# Patient Record
Sex: Female | Born: 1970
Health system: Southern US, Community
[De-identification: ages and names within clinical notes are randomized; demographics above are authoritative.]

## PROBLEM LIST (undated history)

## (undated) DIAGNOSIS — M5030 Other cervical disc degeneration, unspecified cervical region: Secondary | ICD-10-CM

## (undated) DIAGNOSIS — E669 Obesity, unspecified: Secondary | ICD-10-CM

## (undated) DIAGNOSIS — R739 Hyperglycemia, unspecified: Secondary | ICD-10-CM

## (undated) DIAGNOSIS — F419 Anxiety disorder, unspecified: Secondary | ICD-10-CM

## (undated) DIAGNOSIS — F988 Other specified behavioral and emotional disorders with onset usually occurring in childhood and adolescence: Secondary | ICD-10-CM

## (undated) DIAGNOSIS — I10 Essential (primary) hypertension: Secondary | ICD-10-CM

## (undated) DIAGNOSIS — D649 Anemia, unspecified: Secondary | ICD-10-CM

## (undated) HISTORY — DX: Obesity, unspecified: E66.9

## (undated) HISTORY — DX: Essential (primary) hypertension: I10

## (undated) HISTORY — PX: TONSILLECTOMY AND ADENOIDECTOMY: SHX28

## (undated) HISTORY — DX: Anxiety disorder, unspecified: F41.9

## (undated) HISTORY — PX: CHOLECYSTECTOMY: SHX55

## (undated) HISTORY — DX: Anemia, unspecified: D64.9

## (undated) HISTORY — DX: Hyperglycemia, unspecified: R73.9

## (undated) HISTORY — DX: Other cervical disc degeneration, unspecified cervical region: M50.30

## (undated) HISTORY — PX: OVARIAN CYST DRAINAGE: SHX325

## (undated) HISTORY — DX: Other specified behavioral and emotional disorders with onset usually occurring in childhood and adolescence: F98.8

---

## 1998-09-09 ENCOUNTER — Other Ambulatory Visit: Admission: RE | Admit: 1998-09-09 | Discharge: 1998-09-09 | Payer: Self-pay | Admitting: Obstetrics and Gynecology

## 2008-01-09 ENCOUNTER — Emergency Department (HOSPITAL_COMMUNITY): Admission: EM | Admit: 2008-01-09 | Discharge: 2008-01-09 | Payer: Self-pay | Admitting: Emergency Medicine

## 2008-04-08 ENCOUNTER — Ambulatory Visit: Payer: Self-pay | Admitting: Internal Medicine

## 2008-05-02 ENCOUNTER — Ambulatory Visit (HOSPITAL_COMMUNITY): Admission: RE | Admit: 2008-05-02 | Discharge: 2008-05-02 | Payer: Self-pay | Admitting: Internal Medicine

## 2008-05-02 ENCOUNTER — Ambulatory Visit: Payer: Self-pay | Admitting: Internal Medicine

## 2008-05-06 ENCOUNTER — Ambulatory Visit (HOSPITAL_COMMUNITY): Admission: RE | Admit: 2008-05-06 | Discharge: 2008-05-06 | Payer: Self-pay | Admitting: Internal Medicine

## 2009-08-27 ENCOUNTER — Emergency Department (HOSPITAL_COMMUNITY): Admission: EM | Admit: 2009-08-27 | Discharge: 2009-08-27 | Payer: Self-pay | Admitting: Emergency Medicine

## 2011-02-10 LAB — URINE CULTURE
Colony Count: NO GROWTH
Culture: NO GROWTH

## 2011-02-10 LAB — POCT URINALYSIS DIP (DEVICE)
Bilirubin Urine: NEGATIVE
Glucose, UA: NEGATIVE mg/dL
Ketones, ur: NEGATIVE mg/dL
Nitrite: NEGATIVE
Protein, ur: NEGATIVE mg/dL
Specific Gravity, Urine: 1.025 (ref 1.005–1.030)
Urobilinogen, UA: 0.2 mg/dL (ref 0.0–1.0)
pH: 5.5 (ref 5.0–8.0)

## 2011-02-10 LAB — POCT PREGNANCY, URINE: Preg Test, Ur: NEGATIVE

## 2011-03-22 NOTE — Consult Note (Signed)
NAMEJAMILYNN, WHITACRE             ACCOUNT NO.:  [id number]   MEDICAL RECORD NO.:  [id number]          PATIENT TYPE:  AMB   LOCATION:  DAY                           FACILITY:  [facility]   PHYSICIAN:  R. Roetta Sessions, M.D. DATE OF BIRTH:  Mar 22, 1971   DATE OF CONSULTATION:  DATE OF DISCHARGE:                                 CONSULTATION   REQUESTING PHYSICIAN:  Ellan Lambert, MD   REASON FOR CONSULTATION:  Hematochezia and chronic diarrhea.   HISTORY OF PRESENT ILLNESS:  Desiree Carter is a 40 year old female.  She  has a history of chronic diarrhea.  She tells me as long as she can  remember since her teen years, she has had significant postprandial  urgency and more recently she has had severe urgency on her way to work.  She was unable to make it to the bathroom.  She had a loose watery bowel  movement.  She complains of episodes of incontinence at times.  She has  significant abdominal bloating.  She has noticed bright red blood in  small-to-moderate amounts on the toilet paper on at least one occasion.  She notes that the abdominal pain, which is usually sharp and cramp-like  in her low pelvis and mid-abdomen, seems to be better post-defecation.  She tells me her symptoms come and go, but generally has diarrhea 5/7  days of the week.  She has anywhere from one to four loose stools.  She  denies any fevers, chills, nausea, vomiting, heartburn, indigestion,  dysphagia, odynophagia, anorexia, or early satiety.  She denies any new  medications.  She has had some fatigue in the last couple of days.  Laboratory studies from Dr. Christie Beckers office reported normal pelvic  ultrasound through Dr. Christie Beckers office.  She had a MET-7, which was  normal and normal CBC, except the platelet count of 512.  She had normal  LFTs on Mar 24, 2008.   PAST MEDICAL AND SURGICAL HISTORY:  Restless leg syndrome, chronic neck  pain, she is status post tonsillectomy and laparoscopic ovarian  cystectomy, she is status  post cholecystectomy secondary to  cholelithiasis in June 2007, and she has had an abscess removed from her  neck.   CURRENT MEDICATIONS:  See list of current medications;  1. Klonopin 1 mg nightly.  2. Neurontin 400 mg nightly.  3. Tizanidine 4 mg nightly.  4. Birth control pills once daily.  5. Vicodin p.r.n.  6. Allegra once daily.   ALLERGIES:  No known drug allergies.   FAMILY HISTORY:  There is no known family history of colorectal  carcinoma of liver or chronic GI problems.  Both mother and father have  history of hypertension, they are otherwise healthy.  She has one  healthy brother.   SOCIAL HISTORY:  Desiree Carter has been married for 11 years.  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  She denies any tobacco, alcohol, or drug use.   REVIEW OF SYSTEMS:  Desiree Carter has unintentionally lost 10 pounds in the  last year.  She denies any history  of dieting or increasing her  exercise.  GYN:  Her last menstrual period was around February 23, 2008.  She is on continuous birth control pills and has cycles regulated  through Dr. Christie Beckers office every couple of months.  Recent pelvic  ultrasound was negative for intrauterine pregnancy.   PHYSICAL EXAMINATION:  VITAL SIGNS:  Weight 178 pounds, height 60  inches, temperature 98.3, blood pressure 110/76, and pulse 76.  GENERAL:  Desiree Carter is a well-developed and well-nourished Caucasian  female, in no acute distress.  HEENT:  Grossly clear.  Sclerae nonicteric.  Conjunctivae pink.  Oropharynx pink and moist without any lesions.  NECK:  Supple without any mass or thyromegaly.  HEART:  Regular rate and rhythm.  Normal S1 and S2.  No murmurs, clicks,  rubs, or gallops.  LUNGS:  Clear to auscultation bilaterally.  ABDOMEN:  Positive bowel sounds x4.  No bruits auscultated.  Soft,  nontender, and nondistended without palpable mass or hepatosplenomegaly.  No rashes or guarding.  EXTREMITIES:   Without clubbing or edema bilaterally.  SKIN:  Pink, warm, and dry without any rash or jaundice.   IMPRESSION:  Desiree Carter is a 40 year old Caucasian female with a history  of chronic diarrhea and significant urgency, suspicious for irritable  bowel syndrome; however, she has recently began to notice intermittent  bloody diarrhea and blood with wiping on the toilet paper.  I feel she  needs further evaluation to rule out colorectal carcinoma as well as  inflammatory bowel disease.  It is quite possible that she could have  benign anorectal bleeding including hemorrhoid or fissure along with her  chronic history of irritable bowel syndrome.   PLAN:  1. Ileocolonoscopy with Dr. Jena Gauss in the near future.  I have      discussed the procedure including risks and benefits which include      but are not limited to bleeding, infection, perforation, and drug      reaction.  She agrees and signed consent will be obtained.  2. Levsin 0.125 mg a.c. and nightly p.r.n. diarrhea, #60 with one      refill.   Thank you Dr. Bernette Redbird for allowing [place] to participate in the care of Mr.  Urshel.      Lorenza Burton, N.P.      Jonathon Bellows, M.D.  Electronically Signed    KJ/MEDQ  D:  04/08/2008  T:  04/09/2008  Job:  [id number]

## 2011-03-22 NOTE — Op Note (Signed)
NAME:  Desiree Carter, Desiree Carter             ACCOUNT NO.:  [id number]   MEDICAL RECORD NO.:  [id number]          PATIENT TYPE:  AMB   LOCATION:  DAY                           FACILITY:  [facility]   PHYSICIAN:  R. Roetta Sessions, M.D. DATE OF BIRTH:  December 18, 1970   DATE OF PROCEDURE:  05/02/2008  DATE OF DISCHARGE:                               OPERATIVE REPORT   INDICATIONS FOR PROCEDURE:  Chronic diarrhea, significant urgency, and 2-  3 day episode of hematochezia.  Colonoscopy is now being done.  Risks,  benefits, and alternatives have been reviewed, questions answered, and  she is agreeable.  Please see documentation and medical record.   PROCEDURE NOTE:  O2 saturation, blood pressure, pulse, and respirations  were monitored throughout the entirety of the procedure.   CONSCIOUS SEDATION:  Versed 7 mg IV, Demerol 150 mg IV in divided doses.  Phenergan 12.5 mg IV diluted slow IV push to augment conscious sedation.   INSTRUMENT:  Pentax video chip system.   FINDINGS:  Digital rectal exam revealed no abnormalities.   ENDOSCOPIC FINDINGS:  The prep was adequate.  Colon:  Colonic mucosa was  surveyed from the rectosigmoid junction through the left transverse and  right colon to the appendiceal orifice, ileocecal valve, and cecum.  These structures were well seen and photographed for the record.  Terminal ileum was intubated at 10 cm.  From this level, the scope  slowly and cautiously withdrawn.  All previously mentioned mucosal  surfaces were again seen.  It is notable that the cecum was markedly  deformed secondary to significant extrinsic compression.  I did not see  the appendiceal orifice. However, the ileocecal valve structure believed  to be ileocecal valve was confirmed, as I intubated the terminal ileum  as described above.  The mucosal surfaces of the cecum were felt to have  been seen well.  However, there appeared to be gross extrinsic  compression on the cecum.  Please see photos.  From  this level, the  scope was slowly and cautiously withdrawn.  All previously mentioned  mucosal surfaces were again seen.  Stool sample was suctioned out for  microbiology studies.  The colonic mucosa appeared normal.  Scope was  pulled down the rectum.  The rectal vault was quite narrowed.  I did not  even attempt to retroflex, but for the same reason I was able to see the  rectal mucosa very well en face down to the anal canal.  The patient had  friable anal canal tissue and no significant hemorrhoids.  The rectal  mucosa appeared normal, as did the terminal ileal mucosa.  The patient  tolerated the procedure well and was reactive to endoscopy.   IMPRESSION:  Friable anal canal, otherwise normal rectum, normal-  appearing colonic mucosa with significant extrinsic compression of the  cecum of uncertain cause or significance.  Normal terminal ileum.   RECOMMENDATIONS:  1. Follow up on stool.  2. Proceed with abdominal pelvic CT to evaluate the right upper      quadrant further.  Once again, I will go ahead and do a CBC and a  celiac panel to wrap up the workup to this point.  Further      recommendations to follow.  3. We will go ahead and give her a 10-day course of Anusol HC      suppositories one per rectum at bedtime.      Jonathon Bellows, M.D.  Electronically Signed     RMR/MEDQ  D:  05/02/2008  T:  05/03/2008  Job:  [id number]   cc:   Brayton Mars, M.D.   Lia Hopping  Fax: [phone]

## 2011-08-04 LAB — CBC
HCT: 37.1
Hemoglobin: 13
MCHC: 35
MCV: 86.7
Platelets: 465 — ABNORMAL HIGH
RBC: 4.28
RDW: 12
WBC: 11 — ABNORMAL HIGH

## 2011-08-04 LAB — CLOSTRIDIUM DIFFICILE EIA: C difficile Toxins A+B, EIA: NEGATIVE

## 2011-08-04 LAB — FECAL LACTOFERRIN, QUANT: Fecal Lactoferrin: NEGATIVE

## 2011-08-04 LAB — STOOL CULTURE

## 2011-08-04 LAB — GLIADIN ANTIBODIES, SERUM
Gliadin IgA: 1.4 U/mL (ref <7)
Gliadin IgG: 0.5 U/mL (ref <7)

## 2011-08-04 LAB — OVA AND PARASITE EXAMINATION: Ova and parasites: NONE SEEN

## 2011-08-04 LAB — RETICULIN AB, IGA

## 2011-08-04 LAB — TISSUE TRANSGLUTAMINASE, IGA: Tissue Transglutaminase Ab, IgA: 0.6 U/mL (ref <7)

## 2014-03-18 DIAGNOSIS — G2581 Restless legs syndrome: Secondary | ICD-10-CM | POA: Insufficient documentation

## 2014-03-18 DIAGNOSIS — G4700 Insomnia, unspecified: Secondary | ICD-10-CM | POA: Insufficient documentation

## 2014-04-21 DIAGNOSIS — M79675 Pain in left toe(s): Secondary | ICD-10-CM | POA: Insufficient documentation

## 2014-06-30 DIAGNOSIS — R7309 Other abnormal glucose: Secondary | ICD-10-CM | POA: Insufficient documentation

## 2014-06-30 DIAGNOSIS — E669 Obesity, unspecified: Secondary | ICD-10-CM | POA: Insufficient documentation

## 2014-06-30 DIAGNOSIS — Z0000 Encounter for general adult medical examination without abnormal findings: Secondary | ICD-10-CM | POA: Insufficient documentation

## 2014-08-11 DIAGNOSIS — L219 Seborrheic dermatitis, unspecified: Secondary | ICD-10-CM | POA: Insufficient documentation

## 2014-09-02 DIAGNOSIS — N760 Acute vaginitis: Secondary | ICD-10-CM | POA: Insufficient documentation

## 2014-09-23 DIAGNOSIS — N898 Other specified noninflammatory disorders of vagina: Secondary | ICD-10-CM | POA: Insufficient documentation

## 2014-11-03 DIAGNOSIS — K219 Gastro-esophageal reflux disease without esophagitis: Secondary | ICD-10-CM | POA: Insufficient documentation

## 2014-12-25 DIAGNOSIS — M216X2 Other acquired deformities of left foot: Secondary | ICD-10-CM | POA: Insufficient documentation

## 2014-12-25 DIAGNOSIS — M2580 Other specified joint disorders, unspecified joint: Secondary | ICD-10-CM | POA: Insufficient documentation

## 2014-12-25 DIAGNOSIS — M7750 Other enthesopathy of unspecified foot: Secondary | ICD-10-CM | POA: Insufficient documentation

## 2014-12-25 DIAGNOSIS — M216X1 Other acquired deformities of right foot: Secondary | ICD-10-CM | POA: Insufficient documentation

## 2014-12-25 DIAGNOSIS — M722 Plantar fascial fibromatosis: Secondary | ICD-10-CM | POA: Insufficient documentation

## 2015-04-07 DIAGNOSIS — A0472 Enterocolitis due to Clostridium difficile, not specified as recurrent: Secondary | ICD-10-CM | POA: Insufficient documentation

## 2015-04-07 DIAGNOSIS — K580 Irritable bowel syndrome with diarrhea: Secondary | ICD-10-CM | POA: Insufficient documentation

## 2015-07-20 DIAGNOSIS — N871 Moderate cervical dysplasia: Secondary | ICD-10-CM | POA: Insufficient documentation

## 2015-09-01 DIAGNOSIS — I10 Essential (primary) hypertension: Secondary | ICD-10-CM | POA: Insufficient documentation

## 2015-09-01 DIAGNOSIS — M545 Low back pain, unspecified: Secondary | ICD-10-CM | POA: Insufficient documentation

## 2015-09-01 DIAGNOSIS — F411 Generalized anxiety disorder: Secondary | ICD-10-CM | POA: Insufficient documentation

## 2015-09-01 DIAGNOSIS — G8929 Other chronic pain: Secondary | ICD-10-CM | POA: Insufficient documentation

## 2015-09-01 DIAGNOSIS — D649 Anemia, unspecified: Secondary | ICD-10-CM | POA: Insufficient documentation

## 2015-09-01 DIAGNOSIS — R7309 Other abnormal glucose: Secondary | ICD-10-CM | POA: Insufficient documentation

## 2015-09-01 DIAGNOSIS — J309 Allergic rhinitis, unspecified: Secondary | ICD-10-CM | POA: Insufficient documentation

## 2015-09-29 DIAGNOSIS — F988 Other specified behavioral and emotional disorders with onset usually occurring in childhood and adolescence: Secondary | ICD-10-CM | POA: Insufficient documentation

## 2015-10-29 DIAGNOSIS — M5030 Other cervical disc degeneration, unspecified cervical region: Secondary | ICD-10-CM | POA: Insufficient documentation

## 2015-11-23 DIAGNOSIS — L989 Disorder of the skin and subcutaneous tissue, unspecified: Secondary | ICD-10-CM | POA: Insufficient documentation

## 2015-12-04 ENCOUNTER — Other Ambulatory Visit: Payer: Self-pay | Admitting: *Deleted

## 2015-12-04 DIAGNOSIS — I872 Venous insufficiency (chronic) (peripheral): Secondary | ICD-10-CM

## 2015-12-10 ENCOUNTER — Encounter: Payer: Self-pay | Admitting: Vascular Surgery

## 2015-12-25 ENCOUNTER — Encounter: Payer: Self-pay | Admitting: Vascular Surgery

## 2016-01-01 ENCOUNTER — Ambulatory Visit (HOSPITAL_COMMUNITY)
Admission: RE | Admit: 2016-01-01 | Discharge: 2016-01-01 | Disposition: A | Discharge status: Home / Self Care | Payer: Managed Care, Other (non HMO) | Source: Ambulatory Visit | Attending: Vascular Surgery | Admitting: Vascular Surgery

## 2016-01-01 ENCOUNTER — Encounter: Payer: Self-pay | Admitting: Vascular Surgery

## 2016-01-01 ENCOUNTER — Ambulatory Visit (INDEPENDENT_AMBULATORY_CARE_PROVIDER_SITE_OTHER): Payer: Managed Care, Other (non HMO) | Admitting: Vascular Surgery

## 2016-01-01 VITALS — BP 146/98 | HR 97 | Ht 62.0 in | Wt 190.8 lb

## 2016-01-01 DIAGNOSIS — I872 Venous insufficiency (chronic) (peripheral): Secondary | ICD-10-CM | POA: Diagnosis not present

## 2016-01-01 DIAGNOSIS — E119 Type 2 diabetes mellitus without complications: Secondary | ICD-10-CM | POA: Diagnosis not present

## 2016-01-01 DIAGNOSIS — I10 Essential (primary) hypertension: Secondary | ICD-10-CM | POA: Diagnosis not present

## 2016-01-01 DIAGNOSIS — R600 Localized edema: Secondary | ICD-10-CM | POA: Diagnosis present

## 2016-01-01 NOTE — Progress Notes (Signed)
HISTORY AND PHYSICAL     CC:  Chronic leg pain, cold feet; ? Stasis  Referring Provider:  Rebecka Apley, RN  HPI: This is a 45 y.o. female who presents today with c/o her legs aching, being cold and mild swelling.  She states that she had a rash and it was biopsied by her PCP and it came back as "stasis changes".  She states that she has a scar on the left leg just about the ankle (laterally), which continues to enlarge that she originally thought was a flea bite.   It is not open or draining.  She states that she has never had children.  She has a job that she primarily sits most of the day. She does not have any family hx of varicose veins.  She thought she had restless leg syndrome and is on Lyrica, which she states helps her legs.  She also takes a Vicodin with this and this helps with her pain and allows her to sleep.  She does elevate her legs sometimes in the evenings, but this helps minimally.  She has not worn compression.    She states that she has an irregular rhythm that is fast and sometimes skips a beat.  She states she was placed on Toprol for this.  She takes Metformin for her diabetes.  Her last A1c was 6.4.    Past Medical History  Diagnosis Date  . Hypertension   . Degenerative disc disease, cervical   . Anemia   . ADD (attention deficit disorder)   . Elevated hemoglobin A1c   . Anxiety   . Obesity   . Diabetes mellitus without complication (HCC)     No past surgical history on file.  Allergies  Allergen Reactions  . Metronidazole Swelling  . Morphine Other (See Comments)  . Propoxyphene Nausea And Vomiting    Current Outpatient Prescriptions  Medication Sig Dispense Refill  . amphetamine-dextroamphetamine (ADDERALL XR) 10 MG 24 hr capsule Take 10 mg by mouth daily.    Marland Kitchen amphetamine-dextroamphetamine (ADDERALL XR) 20 MG 24 hr capsule Take by mouth.    . cetirizine (ZYRTEC) 10 MG tablet Take 10 mg by mouth daily.    . clonazePAM (KLONOPIN) 1 MG tablet  Take 1 mg by mouth.    . Eluxadoline (VIBERZI PO) Take by mouth.    . esomeprazole (NEXIUM) 40 MG capsule Take 40 mg by mouth.    . fluconazole (DIFLUCAN) 150 MG tablet Take 150 mg by mouth daily.    Marland Kitchen HYDROcodone-acetaminophen (NORCO/VICODIN) 5-325 MG tablet Take 1 tablet by mouth.    Marland Kitchen lisinopril (PRINIVIL,ZESTRIL) 20 MG tablet Take 20 mg by mouth daily.    . metFORMIN (GLUCOPHAGE) 500 MG tablet Take by mouth.    . metoprolol succinate (TOPROL-XL) 100 MG 24 hr tablet Take 100 mg by mouth daily. Take with or immediately following a meal.    . norgestimate-ethinyl estradiol (ORTHO-CYCLEN,SPRINTEC,PREVIFEM) 0.25-35 MG-MCG tablet Take 1 tablet by mouth daily.    . pregabalin (LYRICA) 150 MG capsule Take 150 mg by mouth.    Marland Kitchen tiZANidine (ZANAFLEX) 4 MG tablet Take 4 mg by mouth.    . valACYclovir (VALTREX) 500 MG tablet Take 500 mg by mouth as needed.     . phenazopyridine (PYRIDIUM) 100 MG tablet Take 100 mg by mouth. Reported on 01/01/2016     No current facility-administered medications for this visit.    No family history on file.  Social History   Social History  .  Marital Status: Unknown    Spouse Name: N/A  . Number of Children: N/A  . Years of Education: N/A   Occupational History  . Not on file.   Social History Main Topics  . Smoking status: Never Smoker   . Smokeless tobacco: Never Used  . Alcohol Use: No  . Drug Use: No  . Sexual Activity: Not on file   Other Topics Concern  . Not on file   Social History Narrative     REVIEW OF SYSTEMS:    denotes positive finding,  denotes negative finding Cardiac  Comments:  Chest pain or chest pressure:    Shortness of breath upon exertion:    Short of breath when lying flat:    Irregular heart rhythm: x       Vascular    Pain in calf, thigh, or hip brought on by ambulation:    Pain in feet at night that wakes you up from your sleep:     Blood clot in your veins:    Leg swelling:  x Minimal at right ankle       Pulmonary    Oxygen at home:    Productive cough:     Wheezing:         Neurologic    Sudden weakness in arms or legs:     Sudden numbness in arms or legs:     Sudden onset of difficulty speaking or slurred speech:    Temporary loss of vision in one eye:     Problems with dizziness:         Gastrointestinal    Blood in stool:  x May 2016  Vomited blood:         Genitourinary    Burning when urinating:     Blood in urine: x UTI      Psychiatric    Major depression:         Hematologic    Bleeding problems: x See above  Problems with blood clotting too easily:        Skin    Rashes or ulcers: x Rash bilateral legs      Constitutional    Fever or chills:      PHYSICAL EXAMINATION:  Filed Vitals:   01/01/16 1413 01/01/16 1419  BP: 157/102 146/98  Pulse: 97    Body mass index is 34.89 kg/(m^2).  General:  WDWN in NAD; vital signs documented above Gait: Normal HENT: WNL, normocephalic Pulmonary: normal non-labored breathing , without Rales, rhonchi,  wheezing Cardiac: regular HR, without  Murmurs, rubs or gallops; without carotid bruits Abdomen: soft, NT, no masses Skin: without rashes Vascular Exam/Pulses:  Right Left  Radial 2+ (normal) 2+ (normal)  Ulnar 2+ (normal) 2+ (normal)  DP 2+ (normal) 2+ (normal)  PT Unable to palpate  Unable to palpate    Extremities: without ischemic changes, without Gangrene , without cellulitis; without open wounds; circular appearing scar left lateral leg just proximal to ankle; no varicosities are visible  Musculoskeletal: no muscle wasting or atrophy  Neurologic: A&O X 3;  No focal weakness or paresthesias are detected Psychiatric:  The pt has Normal affect. Lymph : No Cervical, Axillary, or Inguinal lymphadenopathy     Non-Invasive Vascular Imaging:   Lower extremity venous duplex evaluation 01/01/16: 1.  No evidence of bilateral DVT 2.  Reflux in right SFV 3.  Reflux in the right GSV proximal to mid thigh and in  the mid calf 4.  Reflux in  the left CFV 5.  Reflux in the left GSV in the distal thigh to knee and in the mid calf area 6.  No reflux in the bilateral SSV  Pt meds includes: Statin:  No. Beta Blocker:  Yes.   Aspirin:  No. ACEI:  Yes.   ARB:  No. Other Antiplatelet/Anticoagulant:  No.    ASSESSMENT/PLAN:: 45 y.o. female with leg pain/cold feet, BLE chronic venous insufficiency   -she does describe some aching in her legs with minimal swelling in the right ankle -she may have a small component of venous reflux, but on her study today, this is mild and most likely not contributing a lot to her sx.   -she is given information on Elastic Therapy today if she elects to try compression.  She is also advised to try leg elevation. -she will f/u with [place] as needed should her achiness worsen or she starts to have significant swelling in her legs.   Doreatha Massed, PA-C [facility] [phone]  Clinic MD:  Pt seen and examined in conjunction with Dr. Imogene Burn  Addendum  I have independently interviewed and examined the patient, and I agree with the physician assistant's findings.  Pt's BLE venous insufficiency duplex demonstrates segmental incompetence without any long-segment incompetence.  Subsequently, I doubt her CVI accounts for her sx and prior skin findings.  She has been instructed in regards compression stocking use, as I suspect she is early in her CVI and the natural history is that venous incompetence worsens with time.  Leonides Sake, MD [facility] Office: [phone] Pager: [phone]  01/01/2016, 7:09 PM

## 2016-01-05 ENCOUNTER — Encounter: Payer: Self-pay | Admitting: Adult Health Nurse Practitioner

## 2017-08-10 DIAGNOSIS — F900 Attention-deficit hyperactivity disorder, predominantly inattentive type: Secondary | ICD-10-CM | POA: Diagnosis not present

## 2017-08-10 DIAGNOSIS — E119 Type 2 diabetes mellitus without complications: Secondary | ICD-10-CM | POA: Diagnosis not present

## 2017-08-10 DIAGNOSIS — I10 Essential (primary) hypertension: Secondary | ICD-10-CM | POA: Diagnosis not present

## 2017-08-10 DIAGNOSIS — M545 Low back pain: Secondary | ICD-10-CM | POA: Diagnosis not present

## 2017-09-21 DIAGNOSIS — Z131 Encounter for screening for diabetes mellitus: Secondary | ICD-10-CM | POA: Diagnosis not present

## 2017-09-21 DIAGNOSIS — Z1151 Encounter for screening for human papillomavirus (HPV): Secondary | ICD-10-CM | POA: Diagnosis not present

## 2017-09-21 DIAGNOSIS — Z130 Encounter for screening for diseases of the blood and blood-forming organs and certain disorders involving the immune mechanism: Secondary | ICD-10-CM | POA: Diagnosis not present

## 2017-09-21 DIAGNOSIS — Z3041 Encounter for surveillance of contraceptive pills: Secondary | ICD-10-CM | POA: Diagnosis not present

## 2017-09-21 DIAGNOSIS — Z1321 Encounter for screening for nutritional disorder: Secondary | ICD-10-CM | POA: Diagnosis not present

## 2017-09-21 DIAGNOSIS — N871 Moderate cervical dysplasia: Secondary | ICD-10-CM | POA: Diagnosis not present

## 2017-09-21 DIAGNOSIS — Z124 Encounter for screening for malignant neoplasm of cervix: Secondary | ICD-10-CM | POA: Diagnosis not present

## 2017-09-21 DIAGNOSIS — Z01419 Encounter for gynecological examination (general) (routine) without abnormal findings: Secondary | ICD-10-CM | POA: Diagnosis not present

## 2017-09-21 DIAGNOSIS — Z1329 Encounter for screening for other suspected endocrine disorder: Secondary | ICD-10-CM | POA: Diagnosis not present

## 2017-10-18 DIAGNOSIS — S134XXA Sprain of ligaments of cervical spine, initial encounter: Secondary | ICD-10-CM | POA: Diagnosis not present

## 2017-10-18 DIAGNOSIS — M546 Pain in thoracic spine: Secondary | ICD-10-CM | POA: Diagnosis not present

## 2017-10-18 DIAGNOSIS — S338XXA Sprain of other parts of lumbar spine and pelvis, initial encounter: Secondary | ICD-10-CM | POA: Diagnosis not present

## 2017-10-19 DIAGNOSIS — M546 Pain in thoracic spine: Secondary | ICD-10-CM | POA: Diagnosis not present

## 2017-10-19 DIAGNOSIS — S338XXA Sprain of other parts of lumbar spine and pelvis, initial encounter: Secondary | ICD-10-CM | POA: Diagnosis not present

## 2017-10-19 DIAGNOSIS — S134XXA Sprain of ligaments of cervical spine, initial encounter: Secondary | ICD-10-CM | POA: Diagnosis not present

## 2017-11-16 DIAGNOSIS — M545 Low back pain: Secondary | ICD-10-CM | POA: Diagnosis not present

## 2017-11-16 DIAGNOSIS — E119 Type 2 diabetes mellitus without complications: Secondary | ICD-10-CM | POA: Diagnosis not present

## 2017-11-16 DIAGNOSIS — I10 Essential (primary) hypertension: Secondary | ICD-10-CM | POA: Diagnosis not present

## 2017-11-16 DIAGNOSIS — F900 Attention-deficit hyperactivity disorder, predominantly inattentive type: Secondary | ICD-10-CM | POA: Diagnosis not present

## 2018-12-26 ENCOUNTER — Encounter: Payer: Self-pay | Admitting: Cardiology

## 2018-12-26 NOTE — Progress Notes (Signed)
Cardiology Office Note   Date:  12/28/2018   ID:  Desiree Carter, DOB [birth year], MRN [id number]  PCP:  Toma Deiters, MD  Cardiologist:   No primary care provider on file. Referring:  Toma Deiters, MD   Chief Complaint  Patient presents with  . Tachycardia      History of Present Illness: Desiree Carter is a 48 y.o. female who is referred by Toma Deiters, MD for evaluation of palpitations.  The patient presents because he has had a rapid heart rate for some time.  She is also had high blood pressure.  She was treated with beta-blockers and then her hair fell out.  She was switched to Cardizem but her hair was still falling out not going back so she stopped that.  She has remained on lisinopril.  Her blood pressures seem to be relatively well controlled with systolics in the 10 6-1 30 range and diastolics in the 60-80 range.  However, she gets these episodes of palpitations with a rapid heart rate.  She really describes the fact that she does not feel well and she will check her heart rate and it will sometimes be 120.  This happens sporadically.  She has had it at work.  Her resting heart rate is always in the 80s to 90s.  When he goes up she says it is not an abrupt onset.  She is not describing presyncope or syncope but she feels like she loses her appetite and she gets short of breath.  She is not active.  She does grocery shopping.  She might get tired and have to sit down with this activity but she does not necessarily bring on the rapid heart rate.  She does not have any chest pressure, neck or arm discomfort.  She does not have any PND or orthopnea.  She thinks that her heart rate issues started thousand 2014 after she had a virus.   Past Medical History:  Diagnosis Date  . ADD (attention deficit disorder)   . Anemia   . Anxiety   . Degenerative disc disease, cervical   . Hyperglycemia   . Hypertension   . Obesity     Past Surgical History:  Procedure  Laterality Date  . CHOLECYSTECTOMY    . OVARIAN CYST DRAINAGE    . TONSILLECTOMY AND ADENOIDECTOMY       Current Outpatient Medications  Medication Sig Dispense Refill  . cetirizine (ZYRTEC) 10 MG tablet Take 10 mg by mouth daily.    Marland Kitchen esomeprazole (NEXIUM) 40 MG capsule Take 40 mg by mouth.    . fenofibrate (TRICOR) 145 MG tablet Take 145 mg by mouth daily.    Marland Kitchen gabapentin (NEURONTIN) 400 MG capsule     . HYDROcodone-acetaminophen (NORCO/VICODIN) 5-325 MG tablet Take 1 tablet by mouth.    Marland Kitchen lisinopril (PRINIVIL,ZESTRIL) 20 MG tablet Take 20 mg by mouth daily.    . metFORMIN (GLUCOPHAGE) 500 MG tablet Take by mouth.    Marland Kitchen tiZANidine (ZANAFLEX) 4 MG tablet Take 4 mg by mouth.    . valACYclovir (VALTREX) 500 MG tablet Take 500 mg by mouth as needed.      No current facility-administered medications for this visit.     Allergies:   Metronidazole; Morphine; Propoxyphene; and Clindamycin/lincomycin    Social History:  The patient  reports that she has never smoked. She has never used smokeless tobacco. She reports that she does not drink alcohol or use drugs.  Family History:  The patient's family history includes Arthritis in her mother; Prostate cancer in her father.    ROS:  Please see the history of present illness.   Otherwise, review of systems are positive for sore legs, cold feet.   All other systems are reviewed and negative.    PHYSICAL EXAM: VS:  BP 116/80 (BP Location: Right Arm, Patient Position: Sitting)   Pulse 89   Ht 5\' 1"  (1.549 m)   Wt 182 lb (82.6 kg)   BMI 34.39 kg/m  , BMI Body mass index is 34.39 kg/m. GENERAL:  Well appearing HEENT:  Pupils equal round and reactive, fundi not visualized, oral mucosa unremarkable NECK:  No jugular venous distention, waveform within normal limits, carotid upstroke brisk and symmetric, no bruits, no thyromegaly LYMPHATICS:  No cervical, inguinal adenopathy LUNGS:  Clear to auscultation bilaterally BACK:  No CVA  tenderness CHEST:  Unremarkable HEART:  PMI not displaced or sustained,S1 and S2 within normal limits, no S3, no S4, no clicks, no rubs, no murmurs ABD:  Flat, positive bowel sounds normal in frequency in pitch, no bruits, no rebound, no guarding, no midline pulsatile mass, no hepatomegaly, no splenomegaly EXT:  2 plus pulses throughout, no edema, no cyanosis no clubbing SKIN:  No rashes no nodules NEURO:  Cranial nerves II through XII grossly intact, motor grossly intact throughout PSYCH:  Cognitively intact, oriented to person place and time    EKG:  EKG is ordered today. The ekg ordered today demonstrates sinus rhythm, rate 89, axis within normal limits, intervals within normal limits, no acute ST-T wave changes, poor anterior R wave progression.   Recent Labs: No results found for requested labs within last 8760 hours.    Lipid Panel No results found for: CHOL, TRIG, HDL, CHOLHDL, VLDL, LDLCALC, LDLDIRECT    Wt Readings from Last 3 Encounters:  12/28/18 182 lb (82.6 kg)  01/01/16 190 lb 12.8 oz (86.5 kg)      Other studies Reviewed: Additional studies/ records that were reviewed today include: Labs, office records. Review of the above records demonstrates:  Please see elsewhere in the note.     ASSESSMENT AND PLAN:  TACHYCARDIA:    I am going to start with a two week ZIO patch.  I am also going to check blood work to include TSH, CBC and a.m. cortisol level.  Of note given the viral prodrome that proceeded for tachycardia I will check a BNP level although I do not strongly suspect a dilated cardiomyopathy based on her exam.  COLD FEET: She has decreased pedal pulses and cold feet.  "I think would be distinctly unusual for her to have lower extremity vascular disease I will check ABIs.  Her blood pressures are equal in both arms.  HTN: Currently her blood pressures well controlled.  She was not orthostatic.  She will continue with the meds as listed.    Current  medicines are reviewed at length with the patient today.  The patient does not have concerns regarding medicines.  The following changes have been made:  no change  Labs/ tests ordered today include:   Orders Placed This Encounter  Procedures  . TSH  . CBC  . B Nat Peptide  . Cortisol  . LONG TERM MONITOR (3-14 DAYS)  . EKG 12-Lead     Disposition:   FU with me after the ZIO patch    Signed, Rollene Rotunda, MD  12/28/2018 3:26 PM    [facility]

## 2018-12-28 ENCOUNTER — Encounter: Payer: Self-pay | Admitting: Cardiology

## 2018-12-28 ENCOUNTER — Other Ambulatory Visit: Payer: Self-pay

## 2018-12-28 ENCOUNTER — Ambulatory Visit: Payer: BLUE CROSS/BLUE SHIELD | Admitting: Cardiology

## 2018-12-28 VITALS — BP 116/80 | HR 89 | Ht 61.0 in | Wt 182.0 lb

## 2018-12-28 DIAGNOSIS — R209 Unspecified disturbances of skin sensation: Secondary | ICD-10-CM | POA: Insufficient documentation

## 2018-12-28 DIAGNOSIS — R000 Tachycardia, unspecified: Secondary | ICD-10-CM | POA: Diagnosis not present

## 2018-12-28 DIAGNOSIS — Z79899 Other long term (current) drug therapy: Secondary | ICD-10-CM

## 2018-12-28 DIAGNOSIS — R0602 Shortness of breath: Secondary | ICD-10-CM

## 2018-12-28 DIAGNOSIS — R0989 Other specified symptoms and signs involving the circulatory and respiratory systems: Secondary | ICD-10-CM | POA: Insufficient documentation

## 2018-12-28 NOTE — Patient Instructions (Signed)
Medication Instructions:  Continue current medications  If you need a refill on your cardiac medications before your next appointment, please call your pharmacy.  Labwork: TSH, CBC, BNP and Cortisol Level HERE IN OUR OFFICE AT [facility]     You will NOT need to fast   Take the provided lab slips with you to the lab for your blood draw.   When you have your labs (blood work) drawn today and your tests are completely normal, you will receive your results only by MyChart Message (if you have MyChart) -OR-  A paper copy in the mail.  If you have any lab test that is abnormal or we need to change your treatment, we will call you to review these results.  Testing/Procedures: Your physician has recommended that you wear a Zio Patch monitor for 14 days. Holter monitors are medical devices that record the heart's electrical activity. Doctors most often use these monitors to diagnose arrhythmias. Arrhythmias are problems with the speed or rhythm of the heartbeat. The monitor is a small, portable device. You can wear one while you do your normal daily activities. This is usually used to diagnose what is causing palpitations/syncope (passing out).  Your physician has requested that you have a lower extremity arterial duplex. This test is an ultrasound of the arteries in the legs or arms. It looks at arterial blood flow in the legs and arms. Allow one hour for Lower and Upper Arterial scans. There are no restrictions or special instructions  Your physician has requested that you have an ankle brachial index (ABI). During this test an ultrasound and blood pressure cuff are used to evaluate the arteries that supply the arms and legs with blood. Allow thirty minutes for this exam. There are no restrictions or special instructions.  Follow-Up: . Your physician recommends that you schedule a follow-up appointment in: After Test   At [facility], you and your health needs are our priority.  As part of our  continuing mission to provide you with exceptional heart care, we have created designated Provider Care Teams.  These Care Teams include your primary Cardiologist (physician) and Advanced Practice Providers (APPs -  Physician Assistants and Nurse Practitioners) who all work together to provide you with the care you need, when you need it.  Thank you for choosing [facility] at [facility]!!

## 2019-01-02 DIAGNOSIS — I10 Essential (primary) hypertension: Secondary | ICD-10-CM | POA: Diagnosis not present

## 2019-01-02 DIAGNOSIS — M545 Low back pain: Secondary | ICD-10-CM | POA: Diagnosis not present

## 2019-01-02 DIAGNOSIS — F900 Attention-deficit hyperactivity disorder, predominantly inattentive type: Secondary | ICD-10-CM | POA: Diagnosis not present

## 2019-01-02 DIAGNOSIS — E119 Type 2 diabetes mellitus without complications: Secondary | ICD-10-CM | POA: Diagnosis not present

## 2019-01-04 ENCOUNTER — Ambulatory Visit (INDEPENDENT_AMBULATORY_CARE_PROVIDER_SITE_OTHER): Payer: BLUE CROSS/BLUE SHIELD

## 2019-01-04 DIAGNOSIS — R000 Tachycardia, unspecified: Secondary | ICD-10-CM

## 2019-01-04 DIAGNOSIS — Z79899 Other long term (current) drug therapy: Secondary | ICD-10-CM | POA: Diagnosis not present

## 2019-01-04 DIAGNOSIS — R0602 Shortness of breath: Secondary | ICD-10-CM | POA: Diagnosis not present

## 2019-01-05 LAB — CBC
Hematocrit: 36.5 % (ref 34.0–46.6)
Hemoglobin: 12.7 g/dL (ref 11.1–15.9)
MCH: 30.7 pg (ref 26.6–33.0)
MCHC: 34.8 g/dL (ref 31.5–35.7)
MCV: 88 fL (ref 79–97)
Platelets: 408 10*3/uL (ref 150–450)
RBC: 4.14 x10E6/uL (ref 3.77–5.28)
RDW: 11.9 % (ref 11.7–15.4)
WBC: 7.1 10*3/uL (ref 3.4–10.8)

## 2019-01-05 LAB — BRAIN NATRIURETIC PEPTIDE: BNP: 9.2 pg/mL (ref 0.0–100.0)

## 2019-01-05 LAB — TSH: TSH: 2.5 u[IU]/mL (ref 0.450–4.500)

## 2019-01-05 LAB — CORTISOL: Cortisol: 12.7 ug/dL

## 2019-01-24 DIAGNOSIS — R000 Tachycardia, unspecified: Secondary | ICD-10-CM | POA: Diagnosis not present

## 2019-01-25 ENCOUNTER — Encounter: Payer: Self-pay | Admitting: Cardiology

## 2019-01-25 ENCOUNTER — Encounter (HOSPITAL_COMMUNITY): Payer: Self-pay

## 2019-01-25 ENCOUNTER — Ambulatory Visit (HOSPITAL_COMMUNITY)
Admission: RE | Admit: 2019-01-25 | Discharge: 2019-01-25 | Disposition: A | Discharge status: Home / Self Care | Payer: BLUE CROSS/BLUE SHIELD | Source: Ambulatory Visit | Attending: Cardiology | Admitting: Cardiology

## 2019-01-25 DIAGNOSIS — R0989 Other specified symptoms and signs involving the circulatory and respiratory systems: Secondary | ICD-10-CM

## 2019-01-25 DIAGNOSIS — R209 Unspecified disturbances of skin sensation: Secondary | ICD-10-CM

## 2019-02-05 ENCOUNTER — Telehealth (INDEPENDENT_AMBULATORY_CARE_PROVIDER_SITE_OTHER): Payer: BLUE CROSS/BLUE SHIELD | Admitting: Cardiology

## 2019-02-05 VITALS — BP 125/82 | HR 78 | Ht 61.0 in | Wt 182.0 lb

## 2019-02-05 DIAGNOSIS — R209 Unspecified disturbances of skin sensation: Secondary | ICD-10-CM

## 2019-02-05 DIAGNOSIS — R000 Tachycardia, unspecified: Secondary | ICD-10-CM | POA: Diagnosis not present

## 2019-02-05 DIAGNOSIS — Z7189 Other specified counseling: Secondary | ICD-10-CM | POA: Diagnosis not present

## 2019-02-05 DIAGNOSIS — I10 Essential (primary) hypertension: Secondary | ICD-10-CM | POA: Diagnosis not present

## 2019-02-05 NOTE — Patient Instructions (Signed)
Medication Instructions:  Continue current medications  If you need a refill on your cardiac medications before your next appointment, please call your pharmacy.  Labwork: None Ordered   Testing/Procedures: None Ordered  Follow-Up: You will need a follow up appointment in 6 months.  Please call our office 2 months in advance to schedule this appointment.  You may see Desiree Hochrein, MD or one of the following Advanced Practice Providers on your designated Care Team:   Rhonda Barrett, PA-C . Kathryn Lawrence, DNP, ANP    At [facility], you and your health needs are our priority.  As part of our continuing mission to provide you with exceptional heart care, we have created designated Provider Care Teams.  These Care Teams include your primary Cardiologist (physician) and Advanced Practice Providers (APPs -  Physician Assistants and Nurse Practitioners) who all work together to provide you with the care you need, when you need it.  Thank you for choosing [facility] at [facility]!!     

## 2019-02-05 NOTE — Progress Notes (Signed)
Virtual Visit via Telephone Note    Evaluation Performed:  Follow-up visit  This visit type was conducted due to national recommendations for restrictions regarding the COVID-19 Pandemic (e.g. social distancing).  This format is felt to be most appropriate for this patient at this time.  All issues noted in this document were discussed and addressed.  No physical exam was performed (except for noted visual exam findings with Video Visits).  Please refer to the patient's chart (MyChart message for video visits and phone note for telephone visits) for the patient's consent to telehealth for [facility].  Date:  02/05/2019   ID:  Desiree Carter, DOB May 20, 1971, MRN [id number]  Patient Location:  Patient home  Provider location:   [place], [place]  PCP:  No primary care provider on file.  Cardiologist:  Rollene Rotunda, MD  Electrophysiologist:  None   Chief Complaint:  Palpitations  History of Present Illness:    Desiree Carter is a 48 y.o. female who presents via audio/video conferencing for a telehealth visit today.    I saw her recently because of palpitations.  She also had cold feet.  She been managed for hypertension.  We did several studies and I reviewed these with her today.  I went over her event monitor.  She hit the button several times for things like dizziness and palpitations.  She had very rare PVCs and PACs and no sustained arrhythmias.  Times when she hit the button she might be having sinus tachycardia with rates from 110s to 130s.  She says that she is off work now because of the virus.  She said that she would always get the symptoms when she ate lunch at work but since she is not been working she is been feeling fine.  She is not having any other presyncope or syncope.  Denies any chest pressure, neck or arm discomfort.  She is having no new shortness of breath, PND or orthopnea.  Of note we also reviewed blood work which was normal to include normal TSH and  CBC.  Finally she had been complaining of cold feet which is an ongoing problem.  However, ABIs were done and were normal and I reviewed this with her.  The patient does not have symptoms concerning for COVID-19 infection (fever, chills, cough, or new shortness of breath).    Prior CV studies:   The following studies were reviewed today:  ZIO patch, ABIs and labs  Past Medical History:  Diagnosis Date  . ADD (attention deficit disorder)   . Anemia   . Anxiety   . Degenerative disc disease, cervical   . Hyperglycemia   . Hypertension   . Obesity    Past Surgical History:  Procedure Laterality Date  . CHOLECYSTECTOMY    . OVARIAN CYST DRAINAGE    . TONSILLECTOMY AND ADENOIDECTOMY       Current Meds  Medication Sig  . cetirizine (ZYRTEC) 10 MG tablet Take 10 mg by mouth daily.  Marland Kitchen esomeprazole (NEXIUM) 40 MG capsule Take 40 mg by mouth.  . fenofibrate (TRICOR) 145 MG tablet Take 145 mg by mouth daily.  Marland Kitchen gabapentin (NEURONTIN) 400 MG capsule Take 1,200 mg by mouth at bedtime.   Marland Kitchen HYDROcodone-acetaminophen (NORCO/VICODIN) 5-325 MG tablet Take 1 tablet by mouth.  Marland Kitchen lisinopril (PRINIVIL,ZESTRIL) 40 MG tablet Take 40 mg by mouth daily.   Marland Kitchen tiZANidine (ZANAFLEX) 4 MG tablet Take 4 mg by mouth.  . valACYclovir (VALTREX) 500 MG tablet Take 500  mg by mouth as needed.      Allergies:   Metronidazole; Morphine; Propoxyphene; and Clindamycin/lincomycin   Social History   Tobacco Use  . Smoking status: Never Smoker  . Smokeless tobacco: Never Used  Substance Use Topics  . Alcohol use: No    Alcohol/week: 0.0 standard drinks  . Drug use: No     Family Hx: The patient's family history includes Arthritis in her mother; Prostate cancer in her father.  ROS:   Please see the history of present illness.    As stated in the HPI and negative for all other systems.    Labs/Other Tests and Data Reviewed:    Recent Labs: 01/04/2019: BNP 9.2; Hemoglobin 12.7; Platelets 408; TSH  2.500   Recent Lipid Panel No results found for: CHOL, TRIG, HDL, CHOLHDL, LDLCALC, LDLDIRECT  Wt Readings from Last 3 Encounters:  02/05/19 182 lb (82.6 kg)  12/28/18 182 lb (82.6 kg)  01/01/16 190 lb 12.8 oz (86.5 kg)     Objective:    Vital Signs:  BP 125/82   Pulse 78   Ht 5\' 1"  (1.549 m)   Wt 182 lb (82.6 kg)   BMI 34.39 kg/m    Well nourished, well developed female in no acute distress.   ASSESSMENT & PLAN:    TACHCYARDIA: We discussed this at length today.  At this point I do not see any automatic arrhythmia or other such as atrial fibrillation.  We discussed PRN dosing of potentially propranolol but she would like to avoid this for now.  She has really tolerated calcium channel blockers in the past.  Her hair fell out with beta-blockers.  She would consent to pill in pocket low-dose propranolol if needed but since she is not particularly symptomatic she would rather avoid this.  We discussed the possibility that some of this could be related to anxiety even though she does not feel anxious.  COLD FEET:   There is no evidence of fixed obstructive disease.  No change in therapy is indicated.  I could not exclude vasospasm and we talked about conservative management for this.  HTN:    COVID-19 Education: The signs and symptoms of COVID-19 were discussed with the patient and how to seek care for testing (follow up with PCP or arrange E-visit).  The importance of social distancing was discussed today.  Patient Risk:   After full review of this patient's clinical status, I feel that they are at least moderate risk at this time.  Time:   Today, I have spent 15 minutes with the patient with telehealth technology discussing the above concerns.     Medication Adjustments/Labs and Tests Ordered: Current medicines are reviewed at length with the patient today.  Concerns regarding medicines are outlined above.  Tests Ordered: No orders of the defined types were placed in this  encounter.  Medication Changes: No orders of the defined types were placed in this encounter.   Disposition:  Follow up prn  Signed, Rollene Rotunda, MD  02/05/2019 11:14 AM    [facility]

## 2019-03-04 DIAGNOSIS — E119 Type 2 diabetes mellitus without complications: Secondary | ICD-10-CM | POA: Diagnosis not present

## 2019-03-04 DIAGNOSIS — F900 Attention-deficit hyperactivity disorder, predominantly inattentive type: Secondary | ICD-10-CM | POA: Diagnosis not present

## 2019-03-04 DIAGNOSIS — M545 Low back pain: Secondary | ICD-10-CM | POA: Diagnosis not present

## 2019-03-04 DIAGNOSIS — I10 Essential (primary) hypertension: Secondary | ICD-10-CM | POA: Diagnosis not present

## 2019-03-18 DIAGNOSIS — S233XXA Sprain of ligaments of thoracic spine, initial encounter: Secondary | ICD-10-CM | POA: Diagnosis not present

## 2019-03-18 DIAGNOSIS — S336XXA Sprain of sacroiliac joint, initial encounter: Secondary | ICD-10-CM | POA: Diagnosis not present

## 2019-03-18 DIAGNOSIS — S134XXA Sprain of ligaments of cervical spine, initial encounter: Secondary | ICD-10-CM | POA: Diagnosis not present

## 2019-03-18 DIAGNOSIS — M546 Pain in thoracic spine: Secondary | ICD-10-CM | POA: Diagnosis not present

## 2019-05-06 DIAGNOSIS — Z6833 Body mass index (BMI) 33.0-33.9, adult: Secondary | ICD-10-CM | POA: Diagnosis not present

## 2019-05-06 DIAGNOSIS — Z0000 Encounter for general adult medical examination without abnormal findings: Secondary | ICD-10-CM | POA: Diagnosis not present

## 2019-06-21 DIAGNOSIS — G44229 Chronic tension-type headache, not intractable: Secondary | ICD-10-CM | POA: Diagnosis not present

## 2019-06-21 DIAGNOSIS — G43909 Migraine, unspecified, not intractable, without status migrainosus: Secondary | ICD-10-CM | POA: Diagnosis not present

## 2019-07-03 DIAGNOSIS — K210 Gastro-esophageal reflux disease with esophagitis: Secondary | ICD-10-CM | POA: Diagnosis not present

## 2019-07-03 DIAGNOSIS — G43909 Migraine, unspecified, not intractable, without status migrainosus: Secondary | ICD-10-CM | POA: Diagnosis not present

## 2019-07-03 DIAGNOSIS — E119 Type 2 diabetes mellitus without complications: Secondary | ICD-10-CM | POA: Diagnosis not present

## 2019-07-03 DIAGNOSIS — M545 Low back pain: Secondary | ICD-10-CM | POA: Diagnosis not present

## 2019-07-04 DIAGNOSIS — L259 Unspecified contact dermatitis, unspecified cause: Secondary | ICD-10-CM | POA: Diagnosis not present

## 2019-07-04 DIAGNOSIS — L659 Nonscarring hair loss, unspecified: Secondary | ICD-10-CM | POA: Diagnosis not present

## 2019-07-04 DIAGNOSIS — D485 Neoplasm of uncertain behavior of skin: Secondary | ICD-10-CM | POA: Diagnosis not present

## 2019-08-28 DIAGNOSIS — J029 Acute pharyngitis, unspecified: Secondary | ICD-10-CM | POA: Diagnosis not present

## 2019-08-28 DIAGNOSIS — Z03818 Encounter for observation for suspected exposure to other biological agents ruled out: Secondary | ICD-10-CM | POA: Diagnosis not present

## 2019-09-02 DIAGNOSIS — E119 Type 2 diabetes mellitus without complications: Secondary | ICD-10-CM | POA: Diagnosis not present

## 2019-09-02 DIAGNOSIS — K219 Gastro-esophageal reflux disease without esophagitis: Secondary | ICD-10-CM | POA: Diagnosis not present

## 2019-09-02 DIAGNOSIS — G43909 Migraine, unspecified, not intractable, without status migrainosus: Secondary | ICD-10-CM | POA: Diagnosis not present

## 2019-09-02 DIAGNOSIS — M545 Low back pain: Secondary | ICD-10-CM | POA: Diagnosis not present

## 2019-10-25 DIAGNOSIS — Z03818 Encounter for observation for suspected exposure to other biological agents ruled out: Secondary | ICD-10-CM | POA: Diagnosis not present

## 2020-03-26 ENCOUNTER — Telehealth: Payer: Self-pay | Admitting: *Deleted

## 2020-03-26 NOTE — Telephone Encounter (Signed)
PHONE CALL TO DR. Sharman Crate TO CHECK ON REFERRAL AND THEY SAID WERE UNABLE TO READ THE REFERRAL BECAUSE IT WAS ILEGIBLE. WAS FAXED ON MAY 5 AND REFAXED AGAIN TODAY

## 2020-04-01 ENCOUNTER — Telehealth: Payer: Self-pay

## 2020-04-01 ENCOUNTER — Telehealth: Payer: Self-pay | Admitting: *Deleted

## 2020-04-01 DIAGNOSIS — L659 Nonscarring hair loss, unspecified: Secondary | ICD-10-CM

## 2020-04-01 NOTE — Telephone Encounter (Signed)
Phone call to Dr. Orlie Dakin office to see if they have received the referral for the patient.  Per Dr. Orlie Dakin office they have not received the referral and they would like [place] to resend the referral to [phone].  Patient's referral re-faxed to Dr. Orlie Dakin office.

## 2020-04-01 NOTE — Telephone Encounter (Signed)
Faxed records to Dr. Corky Downs on 03/11/2020

## 2020-04-01 NOTE — Telephone Encounter (Signed)
Patient sent message on 03/11/2020 wanting to get referral to go see Dr. Corky Downs for hairloss.

## 2020-04-14 ENCOUNTER — Telehealth: Payer: Self-pay

## 2020-04-14 NOTE — Telephone Encounter (Signed)
Phone call to Dr. Orlie Dakin office to see if patient has been scheduled for an appointment.  Per Dr. Orlie Dakin office they have called the patient twice and left voicemail's and they've also sent a letter out to the patient and she still hasn't responded.

## 2020-04-29 ENCOUNTER — Telehealth: Payer: Self-pay | Admitting: Physician Assistant

## 2020-04-29 NOTE — Telephone Encounter (Signed)
Patient calling to say that her appointment at [facility] is not until December 16, 2020.  Is there anything she can do or get done for her hairloss until that appointment?  (chart # [phone])

## 2020-05-05 NOTE — Telephone Encounter (Signed)
Please see patient's note. Her last visit here was 07/04/2019

## 2020-05-12 NOTE — Telephone Encounter (Signed)
Phone call to patient to inform her that she would need to schedule an appointment to be seen.  Voicemail left for patient to give the office a call back and schedule an appointment.

## 2020-05-12 NOTE — Telephone Encounter (Signed)
Patient calling back because she has not heard back from [place]. She asked that this message be routed to ST and see if he can make any suggestions.

## 2020-05-22 ENCOUNTER — Other Ambulatory Visit: Payer: Self-pay

## 2020-05-22 ENCOUNTER — Encounter: Payer: Self-pay | Admitting: Physician Assistant

## 2020-05-22 ENCOUNTER — Ambulatory Visit (INDEPENDENT_AMBULATORY_CARE_PROVIDER_SITE_OTHER): Payer: 59 | Admitting: Physician Assistant

## 2020-05-22 DIAGNOSIS — L649 Androgenic alopecia, unspecified: Secondary | ICD-10-CM | POA: Diagnosis not present

## 2020-05-22 DIAGNOSIS — L659 Nonscarring hair loss, unspecified: Secondary | ICD-10-CM

## 2020-05-22 MED ORDER — SPIRONOLACTONE 25 MG PO TABS: 50.0000 mg | ORAL_TABLET | Freq: Two times a day (BID) | ORAL | 5 refills | Status: DC

## 2020-05-22 NOTE — Progress Notes (Signed)
   Follow-Up Visit   Subjective  Desiree Carter is a 49 y.o. female who presents for the following: Alopecia (using rogain x 2 years mens foam referral to [facility] in [place] what else can be done). Does have  a history of cysts on her ovaries and problems with her weight. Her pathology report indicated androgenic alopecia. All labs were WNL with the exception of Vitamin D deficiency.   The following portions of the chart were reviewed this encounter and updated as appropriate: Tobacco  Allergies  Meds  Problems  Med Hx  Surg Hx  Fam Hx      Objective  Well appearing patient in no apparent distress; mood and affect are within normal limits.  A focused examination was performed including scalp. Relevant physical exam findings are noted in the Assessment and Plan.  Objective  Mid Frontal Scalp, Mid Parietal Scalp (2): Significant thinning on top and crown of scalp. Scalp exam WNL- no swelling, lesions or inflammation.   Assessment & Plan  Alopecia  Androgenic alopecia (3) Mid Frontal Scalp; Mid Parietal Scalp (2)  Refer to Dr. Talmage Nap for possible dx of PCOS.    I, Bronc Brosseau, PA-C, have reviewed all documentation's for this visit.  The documentation on 05/22/20 for the exam, diagnosis, procedures and orders are all accurate and complete.

## 2020-05-25 ENCOUNTER — Telehealth: Payer: Self-pay

## 2020-05-25 NOTE — Telephone Encounter (Signed)
Phone call from [facility] stating they need lab results on the patient faxed over to [facility] @ [phone].

## 2020-05-25 NOTE — Telephone Encounter (Signed)
Patient's lab results faxed over to [facility] @ [facility].

## 2020-06-04 ENCOUNTER — Encounter: Payer: Self-pay | Admitting: Physician Assistant

## 2020-06-11 ENCOUNTER — Encounter: Payer: Self-pay | Admitting: Physician Assistant

## 2020-10-26 ENCOUNTER — Other Ambulatory Visit: Payer: Self-pay | Admitting: Physician Assistant

## 2020-11-03 ENCOUNTER — Encounter (HOSPITAL_COMMUNITY): Payer: Self-pay | Admitting: Emergency Medicine

## 2020-11-03 ENCOUNTER — Other Ambulatory Visit: Payer: Self-pay

## 2020-11-03 ENCOUNTER — Ambulatory Visit (HOSPITAL_COMMUNITY)
Admission: EM | Admit: 2020-11-03 | Discharge: 2020-11-03 | Disposition: A | Discharge status: Home / Self Care | Payer: 59 | Attending: Family Medicine | Admitting: Family Medicine

## 2020-11-03 ENCOUNTER — Ambulatory Visit (INDEPENDENT_AMBULATORY_CARE_PROVIDER_SITE_OTHER): Payer: 59

## 2020-11-03 DIAGNOSIS — M25571 Pain in right ankle and joints of right foot: Secondary | ICD-10-CM | POA: Diagnosis not present

## 2020-11-03 DIAGNOSIS — S93421A Sprain of deltoid ligament of right ankle, initial encounter: Secondary | ICD-10-CM | POA: Diagnosis not present

## 2020-11-03 NOTE — Discharge Instructions (Addendum)
-  Wear the ankle brace during the day for the next week.  -Follow-up with sports medicine doctor in 1 week. (information below) -Continue to use your Vicodin for pain

## 2020-11-03 NOTE — ED Provider Notes (Signed)
[facility]    CSN: [id number] Arrival date & time: 11/03/20  6301      History   Chief Complaint Chief Complaint  Patient presents with  . Ankle Pain    HPI Desiree Carter is a 49 y.o. female presenting today with right ankle pain. History of ADD, DDD, hypertension, obesity, chronic back pain controlled on vicodin. States she fell this morning (2 hours ago) and heard her right ankle pop. She believes she everted right ankle. Caught herself on right hand but denies pain or bruising of right hand/knee/hip. Pt with chronic back pain but denies exacerbation of this. R lateral foot immediately started to bruise and hurt. Painful with walking. She is able to move R ankle but with pain. Denies pain shooting down legs, denies numbness in arms/legs, denies weakness in arms/legs, denies saddle anesthesia, denies bowel/bladder incontinence. Denies abrasions.    HPI  Past Medical History:  Diagnosis Date  . ADD (attention deficit disorder)   . Anemia   . Anxiety   . Degenerative disc disease, cervical   . Hyperglycemia   . Hypertension   . Obesity     Patient Active Problem List   Diagnosis Date Noted  . Educated about COVID-19 virus infection 02/05/2019  . Cold feet 12/28/2018  . Decreased pedal pulses 12/28/2018  . SOB (shortness of breath) 12/28/2018  . Tachycardia 12/28/2018  . Chronic venous insufficiency 01/01/2016  . Skin lesion 11/23/2015  . Degenerative disc disease, cervical 10/29/2015  . ADD (attention deficit disorder) 09/29/2015  . Anemia, unspecified 09/01/2015  . Anxiety, generalized 09/01/2015  . Chronic allergic rhinitis 09/01/2015  . Chronic bilateral low back pain without sciatica 09/01/2015  . Elevated hemoglobin A1c 09/01/2015  . Essential hypertension 09/01/2015  . Moderate cervical dysplasia, histologically confirmed 07/20/2015  . C. difficile diarrhea 04/07/2015  . Irritable bowel syndrome with diarrhea 04/07/2015  . Acquired equinus  deformity of both feet 12/25/2014  . Plantar fasciitis, bilateral 12/25/2014  . Sesamoiditis 12/25/2014  . Tendonitis of ankle or foot 12/25/2014  . Gastroesophageal reflux disease without esophagitis 11/03/2014  . Vaginal irritation 09/23/2014  . Vaginitis and vulvovaginitis 09/02/2014  . Dermatitis seborrheica 08/11/2014  . Elevated glucose 06/30/2014  . Encounter for general adult medical examination without abnormal findings 06/30/2014  . Obesity (BMI 30-39.9) 06/30/2014  . Great toe pain, left 04/21/2014  . Insomnia 03/18/2014  . Restless leg syndrome 03/18/2014    Past Surgical History:  Procedure Laterality Date  . CHOLECYSTECTOMY    . OVARIAN CYST DRAINAGE    . TONSILLECTOMY AND ADENOIDECTOMY      OB History   No obstetric history on file.      Home Medications    Prior to Admission medications   Medication Sig Start Date End Date Taking? Authorizing Provider  esomeprazole (NEXIUM) 40 MG capsule Take 40 mg by mouth.   Yes [provider]  fenofibrate (TRICOR) 145 MG tablet Take 145 mg by mouth daily.   Yes [provider]  metFORMIN (GLUCOPHAGE-XR) 500 MG 24 hr tablet Take 1 tablet by mouth daily. 01/30/19  Yes [provider]  spironolactone (ALDACTONE) 25 MG tablet TAKE TWO TABLETS BY MOUTH TWICE DAILY 10/27/20  Yes Janalyn Harder, MD  valACYclovir (VALTREX) 500 MG tablet Take 500 mg by mouth as needed.    Yes [provider]  cetirizine (ZYRTEC) 10 MG tablet Take 10 mg by mouth daily.    [provider]  gabapentin (NEURONTIN) 400 MG capsule Take 1,200  mg by mouth at bedtime.  11/27/18   [provider]  HYDROcodone-acetaminophen (NORCO/VICODIN) 5-325 MG tablet Take 1 tablet by mouth.    [provider]  lisinopril (PRINIVIL,ZESTRIL) 40 MG tablet Take 40 mg by mouth daily.     [provider]  tiZANidine (ZANAFLEX) 4 MG tablet Take 4 mg by mouth.    [provider]  topiramate  (TOPAMAX) 50 MG tablet Take 50 mg by mouth 2 (two) times daily. 03/09/20   [provider]    Family History Family History  Problem Relation Age of Onset  . Arthritis Mother   . Prostate cancer Father     Social History Social History   Tobacco Use  . Smoking status: Never Smoker  . Smokeless tobacco: Never Used  Vaping Use  . Vaping Use: Never used  Substance Use Topics  . Alcohol use: No    Alcohol/week: 0.0 standard drinks  . Drug use: No     Allergies   Metronidazole, Morphine, Propoxyphene, and Clindamycin/lincomycin   Review of Systems Review of Systems  Musculoskeletal:       R foot/ankle pain and bruising  All other systems reviewed and are negative.    Physical Exam Triage Vital Signs ED Triage Vitals  Enc Vitals Group     BP 11/03/20 0925 137/83     Pulse Rate 11/03/20 0925 (!) 105     Resp 11/03/20 0925 18     Temp 11/03/20 0925 98.5 F (36.9 C)     Temp Source 11/03/20 0925 Oral     SpO2 11/03/20 0925 98 %     Weight --      Height --      Head Circumference --      Peak Flow --      Pain Score 11/03/20 0923 8     Pain Loc --      Pain Edu? --      Excl. in GC? --    No data found.  Updated Vital Signs BP 137/83 (BP Location: Left Arm)   Pulse (!) 105   Temp 98.5 F (36.9 C) (Oral)   Resp 18   SpO2 98%   Visual Acuity Right Eye Distance:   Left Eye Distance:   Bilateral Distance:    Right Eye Near:   Left Eye Near:    Bilateral Near:     Physical Exam Vitals reviewed.  Constitutional:      General: She is not in acute distress.    Appearance: Normal appearance.  HENT:     Head: Normocephalic and atraumatic. No abrasion, contusion or laceration.     Nose: No signs of injury.  Eyes:     Extraocular Movements: Extraocular movements intact.     Pupils: Pupils are equal, round, and reactive to light.  Cardiovascular:     Rate and Rhythm: Normal rate and regular rhythm.     Heart sounds: Normal heart sounds.   Pulmonary:     Effort: Pulmonary effort is normal.     Breath sounds: Normal breath sounds and air entry. No wheezing, rhonchi or rales.  Abdominal:     Palpations: Abdomen is soft.     Tenderness: There is no abdominal tenderness. There is no guarding or rebound.  Musculoskeletal:     Cervical back: Full passive range of motion without pain. No signs of trauma.     Comments: Full ROM in UEs and LEs. Strength grossly 5/5 in UEs and LEs. No boney  deformity or tenderness palpated in shoulders/elbows/wrists/hips/knees/ankles/spine/etc. Gait normal.  No spinous or paraspinous muscle tenderness.  No snuffbox tenderness bilaterally.   R foot swelling and tenderness on the lateral aspect along deltoid ligament. Ecchymosis, swelling and tenderness to palpation but no bony deformity. No R calf pain. Negative squeeze test. Full ROM and strength in ankle, but with pain. Full ROM in toes. Neurovascularly intact.   Skin:    Findings: No bruising.     Comments: No abrasions Ecchymosis lateral R foot, no ecchymosis or abrasions elsewhere  Neurological:     General: No focal deficit present.     Mental Status: She is alert and oriented to person, place, and time.     Cranial Nerves: No cranial nerve deficit.     Comments: CN 2-12 grossly intact  Psychiatric:        Attention and Perception: Attention and perception normal.        Mood and Affect: Mood and affect normal.        Behavior: Behavior normal. Behavior is cooperative.        Thought Content: Thought content normal.        Judgment: Judgment normal.      UC Treatments / Results  Labs (all labs ordered are listed, but only abnormal results are displayed) Labs Reviewed - No data to display  EKG   Radiology DG Ankle Complete Right  Result Date: 11/03/2020 CLINICAL DATA:  Fall.  Ankle pain EXAM: RIGHT ANKLE - COMPLETE 3+ VIEW COMPARISON:  None. FINDINGS: There is no evidence of fracture, dislocation, or joint effusion. There is no  evidence of arthropathy or other focal bone abnormality. Soft tissues are unremarkable. IMPRESSION: Negative. Electronically Signed   By: Marlan Palau M.D.   On: 11/03/2020 10:02    Procedures Procedures (including critical care time)  Medications Ordered in UC Medications - No data to display  Initial Impression / Assessment and Plan / UC Course  I have reviewed the triage vital signs and the nursing notes.  Pertinent labs & imaging results that were available during my care of the patient were reviewed by me and considered in my medical decision making (see chart for details).     Xray today negative for bony deformity. For R deltoid ligament sprain, boot provided today. Wear this while ambulating until f/u with ortho. She already takes Vicodin for chronic back pain; continue this. She can also try RICE.   No other injuries. Denies pain shooting down legs, denies numbness in arms/legs, denies weakness in arms/legs, denies saddle anesthesia, denies bowel/bladder incontinence. CN 2-12 intact.   Final Clinical Impressions(s) / UC Diagnoses   Final diagnoses:  Sprain of deltoid ligament of right ankle, initial encounter     Discharge Instructions     -Wear the ankle brace during the day for the next week.  -Follow-up with sports medicine doctor in 1 week. (information below) -Continue to use your Vicodin for pain    ED Prescriptions    None     PDMP not reviewed this encounter.   Rhys Martini, PA-C 11/03/20 [id number]

## 2020-11-03 NOTE — ED Triage Notes (Signed)
Pt states this morning she fell off the porch and heard her right ankle pop. Immediately she states that her ankle began to bruise. Pt states that it is painful to walk.

## 2020-11-09 ENCOUNTER — Other Ambulatory Visit: Payer: Self-pay

## 2020-11-09 ENCOUNTER — Ambulatory Visit (INDEPENDENT_AMBULATORY_CARE_PROVIDER_SITE_OTHER): Payer: 59 | Admitting: Family Medicine

## 2020-11-09 VITALS — BP 145/104 | Ht 60.0 in | Wt 175.0 lb

## 2020-11-09 DIAGNOSIS — S99911A Unspecified injury of right ankle, initial encounter: Secondary | ICD-10-CM

## 2020-11-09 NOTE — Patient Instructions (Signed)
You have a lateral ankle sprain. Ice the area for 15 minutes at a time, 3-4 times a day if you can - don't force yourself to if it is uncomfortable though! Aleve 2 tabs twice a day with food OR ibuprofen 3 tabs three times a day with food for pain and inflammation as needed. Elevate above the level of your heart when possible Bear weight when tolerated Use boot when up and walking around to help with stability while you recover from this injury. Come out of the boot twice a day to do Up/down and alphabet exercises 2-3 sets of each. If not improving as expected, we may repeat x-rays or consider further testing like an MRI. Follow up in 2 weeks - hopefully we can transition you to a laceup brace and start strengthening exercises at that time.

## 2020-11-10 ENCOUNTER — Encounter: Payer: Self-pay | Admitting: Family Medicine

## 2020-11-10 NOTE — Progress Notes (Signed)
PCP: Toma Deiters, MD  Subjective:   HPI: Patient is a 50 y.o. female here for right ankle injury.  Patient reports on 12/28 she accidentally twisted her right ankle. Believes it may have turned more in eversion direction though pain primarily lateral initially. Felt and heard a pop when this occurred. + swelling and bruising. Pain has been more localized recently to plantar right foot, heel. Has been icing some and elevating. Wearing cam walker which has helped with ambulation but feels bulky. No prior right ankle/foot injuries.  Past Medical History:  Diagnosis Date  . ADD (attention deficit disorder)   . Anemia   . Anxiety   . Degenerative disc disease, cervical   . Hyperglycemia   . Hypertension   . Obesity     Current Outpatient Medications on File Prior to Visit  Medication Sig Dispense Refill  . cetirizine (ZYRTEC) 10 MG tablet Take 10 mg by mouth daily.    . fenofibrate (TRICOR) 145 MG tablet Take 145 mg by mouth daily.    . finasteride (PROSCAR) 5 MG tablet Take 5 mg by mouth daily.    Marland Kitchen gabapentin (NEURONTIN) 400 MG capsule Take 1,200 mg by mouth at bedtime.     Marland Kitchen HYDROcodone-acetaminophen (NORCO/VICODIN) 5-325 MG tablet Take 1 tablet by mouth.    Marland Kitchen lisinopril (PRINIVIL,ZESTRIL) 40 MG tablet Take 40 mg by mouth daily.     . metFORMIN (GLUCOPHAGE-XR) 500 MG 24 hr tablet Take 1 tablet by mouth daily.    Marland Kitchen omeprazole (PRILOSEC) 40 MG capsule Take 40 mg by mouth daily.    Marland Kitchen spironolactone (ALDACTONE) 25 MG tablet TAKE TWO TABLETS BY MOUTH TWICE DAILY 60 tablet 5  . tiZANidine (ZANAFLEX) 4 MG tablet Take 4 mg by mouth.    . topiramate (TOPAMAX) 50 MG tablet Take 50 mg by mouth 2 (two) times daily.    . valACYclovir (VALTREX) 1000 MG tablet Take 1,000 mg by mouth 2 (two) times daily.     No current facility-administered medications on file prior to visit.    Past Surgical History:  Procedure Laterality Date  . CHOLECYSTECTOMY    . OVARIAN CYST DRAINAGE    .  TONSILLECTOMY AND ADENOIDECTOMY      Allergies  Allergen Reactions  . Metronidazole Swelling  . Morphine Other (See Comments)  . Propoxyphene Nausea And Vomiting  . Clindamycin/Lincomycin     Social History   Socioeconomic History  . Marital status: Single    Spouse name: Not on file  . Number of children: Not on file  . Years of education: Not on file  . Highest education level: Not on file  Occupational History  . Not on file  Tobacco Use  . Smoking status: Never Smoker  . Smokeless tobacco: Never Used  Vaping Use  . Vaping Use: Never used  Substance and Sexual Activity  . Alcohol use: No    Alcohol/week: 0.0 standard drinks  . Drug use: No  . Sexual activity: Not on file  Other Topics Concern  . 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 Arthritis Mother   .  Prostate cancer Father     BP (!) 145/104   Ht 5' (1.524 m)   Wt 175 lb (79.4 kg)   BMI 34.18 kg/m   No flowsheet data found.  No flowsheet data found.  Review of Systems: See HPI above.     Objective:  Physical Exam:  Gen: NAD, comfortable in exam room  Right foot/ankle: Moderate swelling and bruising dorsolateral foot and lateral ankle. Mild limitation ROM all directions. TTP throughout dorsal foot and lateral ankle - greatest over ATFL.  No tenderness over medial malleolus or deltoid ligament.  No fibular head tenderness. 2+ ant drawer and trace talar tilt.   Negative syndesmotic compression. Thompsons test negative. NV intact distally.  Limited MSK u/s right foot and ankle:  No cortical irregularities of malleoli, base 5th.   Peroneal tendons, medial ankle tendons intact without tears.  No talar dome abnormalities.  No ankle effusion.  ATFL disrupted.  Soft tissue swelling.  Achilles intact.   Assessment & Plan:  1. Right ankle injury - independently reviewed radiographs and no evidence fracture.  Ultrasound today reassuring as well.  Consistent with lateral ankle sprain.  Start motion exercises.  Cam walker to help with ambulation.  Aleve or ibuprofen.  Elevation, icing.  F/u in 2 weeks.  Hopefully can transition to ASO and start strengthening, propioception exercises at that time.

## 2020-11-23 ENCOUNTER — Ambulatory Visit: Payer: 59 | Admitting: Family Medicine

## 2020-11-24 ENCOUNTER — Ambulatory Visit (INDEPENDENT_AMBULATORY_CARE_PROVIDER_SITE_OTHER): Payer: 59 | Admitting: Sports Medicine

## 2020-11-24 ENCOUNTER — Encounter: Payer: Self-pay | Admitting: Sports Medicine

## 2020-11-24 ENCOUNTER — Other Ambulatory Visit: Payer: Self-pay

## 2020-11-24 VITALS — BP 142/90 | Ht 62.0 in | Wt 175.0 lb

## 2020-11-24 DIAGNOSIS — M7582 Other shoulder lesions, left shoulder: Secondary | ICD-10-CM | POA: Diagnosis not present

## 2020-11-24 DIAGNOSIS — S99911D Unspecified injury of right ankle, subsequent encounter: Secondary | ICD-10-CM

## 2020-11-24 NOTE — Patient Instructions (Signed)
Thank you for coming in to see [place] today! Please see below to review our plan for today's visit:  For your ankle:  1.   Please stop using the boot and transition to the lace up ankle brace you are given today.  You may use this as needed based on pain, please especially use it when you are walking uneven surfaces. 2.   You may continue to use anti-inflammatories, compression, elevation and icing. 3.   Please start doing the ankle exercises that were provided today.  For your left shoulder: This is likely rotator cuff tendinitis.  Please start doing the rotator cuff exercises that were provided to you today, please follow-up if this is not improving.  Lets plan to follow-up only as needed if you do not continue to improve.    Please call the clinic at [phone] if your symptoms worsen or you have any concerns. It was our pleasure to serve you.       Dr. Guy Sandifer [facility]

## 2020-11-24 NOTE — Progress Notes (Signed)
PCP: Toma Deiters, MD  Subjective:   HPI: Patient is a 50 y.o. female here for follow-up on right ankle sprain.   Right ankle sprain: Initial injury on 12/28 when she twisted her ankle.  Thought to be likely inversion injury, she was seen here on 11/09/2020 and placed in a cam walker boot and started on ankle exercises.  Today, patient states that she has had improvement in her symptoms.  She does continue to have some pain and swelling over the lateral aspect of her ankle and foot, however she is able to walk around her home barefoot and has been walking around some with shoes.  She has been doing the range of motion exercises as recommended.  She denies any significant weakness, numbness or tingling.  No new injuries.  Left shoulder pain: Patient also notes that she has started having pain in her left shoulder.  She has similar issue in her right shoulder many years ago but over the last couple of months she has had progressive worsening of pain in her left shoulder, especially with internal rotation such as when she is reaching to undo her bra strap.  She also notes pain when she sleeps on her left side at night.  No numbness or tingling, no weakness.   Review of Systems:  Per HPI.   PMFSH, medications and smoking status reviewed.      Objective:  Physical Exam:  No flowsheet data found.   Gen: awake, alert, NAD, comfortable in exam room Pulm: breathing unlabored  Right ankle:  Inspection: Mild edema over the lateral ankle and lateral foot with ecchymoses distally in the toe spaces.  No erythema. ROM: Intact to plantar/dorsiflexion, inversion/eversion.  Some pain with active inversion. Strength: 5/5 strength to resisted plantarflexion/dorsiflexion, inversion, eversion.  Some pain with resisted eversion. Medial/lateral malleolus: Nontender Base 5th Metatarsal: Nontender  Navicular: Nontender  ATFL, CFL, PTFL: Tender over ATFL, otherwise nontender  mortise/tib-talar joint:  nontender Deltoid ligament: nontender Anterior drawer: No laxity or pain  Talar/reverse talar tilt: Very mild laxity with talar tilt.  Left shoulder: -Inspection: no obvious deformity, atrophy, or asymmetry. No bruising. No swelling -Palpation: no TTP over [facility] joint or bicipital groove. -ROM: Full ROM in abduction, flexion, external rotation both passively and actively.  She does have mildly decreased internal rotation compared to the contralateral side with pain.  NV intact distally. Special Tests:  - Impingement: Neg Hawkins, Neg neers, very mildly positive empty can sign. - Supraspinatus: Very mildly positive empty can.  5/5 strength with resisted flexion at 20 degrees - Infraspinatus/Teres Minor: 5/5 strength with ER - Subscapularis: 5/5 strength with IR with minimal pain - Labrum: Negative Obriens, good stability - No painful arc and no drop arm sign     Assessment & Plan:  1.  Right ankle sprain Improving as expected.  We will plan to advance her exercises to band exercises and proprioceptive exercises, and transition into the ASO brace to be used with activity.  Discussed that she transition out of this as pain allows.  Follow-up only as needed if not continue to improve.  2.  Left rotator cuff tendinitis Patient with signs and is consistent with rotator cuff tendinitis without any weakness to suggest tear.  She was given band exercises to do at home, and can use oral NSAIDs as needed for pain.  If she does not have improvement with this, would have her return and consider PT and/or further evaluation with ultrasound at that time.  Guy Sandifer, MD [facility] Fellow 11/24/2020 3:58 PM  Patient seen and evaluated with the sports medicine fellow.  I agree with the above plan of care.  Treatment as above and follow-up as needed.

## 2020-12-20 ENCOUNTER — Encounter: Payer: Self-pay | Admitting: Physician Assistant

## 2020-12-31 ENCOUNTER — Telehealth: Payer: Self-pay | Admitting: *Deleted

## 2020-12-31 MED ORDER — SPIRONOLACTONE 100 MG PO TABS: 100.0000 mg | ORAL_TABLET | Freq: Every morning | ORAL | 2 refills | Status: DC

## 2020-12-31 MED ORDER — MINOXIDIL 10 MG PO TABS: ORAL_TABLET | ORAL | 2 refills | Status: DC

## 2020-12-31 NOTE — Telephone Encounter (Signed)
Phone call to patient per [facility] recommendation. Verbal order given per Mackey Birchwood  - have patient start Nizoral Shampoo, continue Spironolactone 100mg  and start 0.5mg  of Minoxidil daily with 2 refills.  Patient will also need to contact her PCP and follow up about her Vitamin D and recheck her potassium level in 4 weeks, also have patient keep a check on her BP and follow up with in 3 months.  Patient was informed that her prescription of Minoxidil was already called into [facility] @ [phone]. Patient aware, patient states that she will not do the Nizoral Shampoo but she will try everything else.

## 2021-01-18 ENCOUNTER — Ambulatory Visit (INDEPENDENT_AMBULATORY_CARE_PROVIDER_SITE_OTHER): Payer: 59 | Admitting: Family Medicine

## 2021-01-18 ENCOUNTER — Other Ambulatory Visit: Payer: Self-pay

## 2021-01-18 VITALS — BP 128/86 | Ht 62.0 in | Wt 164.0 lb

## 2021-01-18 DIAGNOSIS — M7582 Other shoulder lesions, left shoulder: Secondary | ICD-10-CM | POA: Diagnosis not present

## 2021-01-18 MED ORDER — METHYLPREDNISOLONE ACETATE 40 MG/ML IJ SUSP
40.0000 mg | Freq: Once | INTRAMUSCULAR | Status: AC
  Administered 2021-01-18: 40 mg via INTRA_ARTICULAR

## 2021-01-18 NOTE — Patient Instructions (Signed)
You have rotator cuff impingement of your left shoulder. Try to avoid painful activities (overhead activities, lifting with extended arm) as much as possible. Aleve 2 tabs twice a day with food OR ibuprofen 3 tabs three times a day with food for pain and inflammation as needed. Can take tylenol in addition to this. Subacromial injection may be beneficial to help with pain and to decrease inflammation - you were given this today. Consider physical therapy with transition to home exercise program. Do home exercise program with theraband and scapular stabilization exercises daily 3 sets of 10 once a day - wait about 5 days before restarting these. If not improving at follow-up we will consider further imaging, physical therapy, and/or nitro patches. Follow up with me in 6 weeks but call me sooner if you're struggling and we would go ahead with an MRI.

## 2021-01-19 ENCOUNTER — Encounter: Payer: Self-pay | Admitting: Family Medicine

## 2021-01-19 NOTE — Progress Notes (Signed)
PCP: Toma Deiters, MD  Subjective:   HPI: Patient is a 50 y.o. female here for left shoulder pain.  1/18: Left shoulder pain: Patient also notes that she has started having pain in her left shoulder.  She has similar issue in her right shoulder many years ago but over the last couple of months she has had progressive worsening of pain in her left shoulder, especially with internal rotation such as when she is reaching to undo her bra strap.  She also notes pain when she sleeps on her left side at night.  No numbness or tingling, no weakness.  3/14: Patient reports still having difficulty with left lateral shoulder pain. No new injuries. Pain worse when reaching back, trying to fasten bra. Some benefit with home exercise program. Associated throbbing in left trapezius area. Some night pain. Using topical medication. No numbness/tingling in left upper extremity.  Past Medical History:  Diagnosis Date  . ADD (attention deficit disorder)   . Anemia   . Anxiety   . Degenerative disc disease, cervical   . Hyperglycemia   . Hypertension   . Obesity     Current Outpatient Medications on File Prior to Visit  Medication Sig Dispense Refill  . cetirizine (ZYRTEC) 10 MG tablet Take 10 mg by mouth daily.    . fenofibrate (TRICOR) 145 MG tablet Take 145 mg by mouth daily.    . finasteride (PROSCAR) 5 MG tablet Take 5 mg by mouth daily.    Marland Kitchen gabapentin (NEURONTIN) 400 MG capsule Take 1,200 mg by mouth at bedtime.     Marland Kitchen HYDROcodone-acetaminophen (NORCO/VICODIN) 5-325 MG tablet Take 1 tablet by mouth.    Marland Kitchen lisinopril (PRINIVIL,ZESTRIL) 40 MG tablet Take 40 mg by mouth daily.     . metFORMIN (GLUCOPHAGE-XR) 500 MG 24 hr tablet Take 1 tablet by mouth daily.    . minoxidil (LONITEN) 10 MG tablet 0.5 qam 30 tablet 2  . omeprazole (PRILOSEC) 40 MG capsule Take 40 mg by mouth daily.    Marland Kitchen oxybutynin (DITROPAN-XL) 5 MG 24 hr tablet Take 5 mg by mouth daily.    Marland Kitchen spironolactone (ALDACTONE) 100  MG tablet Take 1 tablet (100 mg total) by mouth every morning. 30 tablet 2  . spironolactone (ALDACTONE) 25 MG tablet TAKE TWO TABLETS BY MOUTH TWICE DAILY 60 tablet 5  . tiZANidine (ZANAFLEX) 4 MG tablet Take 4 mg by mouth.    . topiramate (TOPAMAX) 50 MG tablet Take 50 mg by mouth 2 (two) times daily.    . valACYclovir (VALTREX) 1000 MG tablet Take 1,000 mg by mouth 2 (two) times daily.     No current facility-administered medications on file prior to visit.    Past Surgical History:  Procedure Laterality Date  . CHOLECYSTECTOMY    . OVARIAN CYST DRAINAGE    . TONSILLECTOMY AND ADENOIDECTOMY      Allergies  Allergen Reactions  . Metronidazole Swelling  . Morphine Other (See Comments)  . Propoxyphene Nausea And Vomiting  . Clindamycin/Lincomycin     Social History   Socioeconomic History  . Marital status: Single    Spouse name: Not on file  . Number of children: Not on file  . Years of education: Not on file  . Highest education level: Not on file  Occupational History  . Not on file  Tobacco Use  . Smoking status: Never Smoker  . Smokeless tobacco: Never Used  Vaping Use  . Vaping Use: Never used  Substance and Sexual  Activity  . Alcohol use: No    Alcohol/week: 0.0 standard drinks  . Drug use: No  . Sexual activity: Not on file  Other Topics Concern  . 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 Arthritis Mother   . Prostate cancer Father     BP 128/86   Ht 5\' 2"  (1.575 m)   Wt 164 lb (74.4 kg)   BMI 30.00 kg/m   No flowsheet data found.  No flowsheet data found.  Review of Systems: See HPI  above.     Objective:  Physical Exam:  Gen: NAD, comfortable in exam room  Left shoulder: No swelling, ecchymoses.  No gross deformity. No TTP. FROM with pain on liftoff test but good strength. Mild positive Hawkins, negative Neers. Negative Yergasons. Strength 5/5 with empty can and resisted internal/external rotation. Negative apprehension. NV intact distally.   Assessment & Plan:  1. Left shoulder pain - 2/2 rotator cuff impingement.  Subacromial injection given today.  Continue HEP after 5 days of rest.  Consider formal physical therapy, nitro patches.  F/u in 6 weeks.  After informed written consent timeout was performed, patient was seated in chair in exam room. Left shoulder was prepped with alcohol swab and utilizing lateral approach with ultrasound guidance, patient's left subacromial space was injected with 3:1 lidocaine: depomedrol. Patient tolerated the procedure well without immediate complications.

## 2021-03-08 ENCOUNTER — Other Ambulatory Visit: Payer: Self-pay | Admitting: Physician Assistant

## 2021-03-31 ENCOUNTER — Ambulatory Visit: Payer: 59 | Admitting: Physician Assistant

## 2021-03-31 ENCOUNTER — Encounter: Payer: Self-pay | Admitting: Physician Assistant

## 2021-03-31 ENCOUNTER — Other Ambulatory Visit: Payer: Self-pay

## 2021-03-31 ENCOUNTER — Ambulatory Visit (INDEPENDENT_AMBULATORY_CARE_PROVIDER_SITE_OTHER): Payer: 59 | Admitting: Physician Assistant

## 2021-03-31 DIAGNOSIS — L659 Nonscarring hair loss, unspecified: Secondary | ICD-10-CM

## 2021-03-31 MED ORDER — MINOXIDIL 2.5 MG PO TABS: ORAL_TABLET | ORAL | 1 refills | Status: AC

## 2021-03-31 NOTE — Progress Notes (Deleted)
   Follow-Up Visit   Subjective  Desiree Carter is a 50 y.o. female who presents for the following: Follow-up (Patient here today for hair loss follow up. Per patient the treatment of the Spironolactone and the Finasteride are making her hair thicker but not helping her hair grow. Patient is still experiencing hair loss. ). She does think that it looks better but is psychologically affected by the amount of hair loss she has.  Tolerating the medication well.  The following portions of the chart were reviewed this encounter and updated as appropriate:  Tobacco  Allergies  Meds  Problems  Med Hx  Surg Hx  Fam Hx      Objective  Well appearing patient in no apparent distress; mood and affect are within normal limits.  {Exam:34163::"A full examination was performed including scalp, head, eyes, ears, nose, lips, neck, chest, axillae, abdomen, back, buttocks, bilateral upper extremities, bilateral lower extremities, hands, feet, fingers, toes, fingernails, and toenails. All findings within normal limits unless otherwise noted below."}  Objective  Scalp: Hair seems to be thicker with most thinning on the top and crown  Images       Assessment & Plan  Alopecia Scalp  Increase dose of minoxidil. May topical minoxidil on the left frontal scalp.  minoxidil (LONITEN) 2.5 MG tablet - Scalp    I, Camey Edell, PA-C, have reviewed all documentation's for this visit.  The documentation on 03/31/21 for the exam, diagnosis, procedures and orders are all accurate and complete.

## 2021-04-01 NOTE — Progress Notes (Signed)
   Follow-Up Visit   Subjective  Desiree Carter is a 50 y.o. female who presents for the following: Follow-up (Patient here today for hair loss follow up. Per patient the treatment of the Spironolactone and the Finasteride are making her hair thicker but not helping her hair grow. Patient is still experiencing hair loss. Overall, she feels better about her appearance. I is a difficult psychosocial issue for her. Reviewed note from Dr. Corky Downs from [facility] who is assisting in the care of Desiree Carter.    The following portions of the chart were reviewed this encounter and updated as appropriate:  Tobacco  Allergies  Meds  Problems  Med Hx  Surg Hx  Fam Hx      Objective  Well appearing patient in no apparent distress; mood and affect are within normal limits.  A focused examination was performed including face and scalp. Relevant physical exam findings are noted in the Assessment and Plan.  Objective  Scalp: Hair seems to be thicker with most thinning on the top and crown.   Images       Assessment & Plan  Alopecia Scalp  Increase dose of minoxidil. May use topical minoxidil on the left frontal scalp.  minoxidil (LONITEN) 2.5 MG tablet - Scalp    I, Aliceson Dolbow, PA-C, have reviewed all documentation's for this visit.  The documentation on 04/01/21 for the exam, diagnosis, procedures and orders are all accurate and complete.

## 2021-04-11 ENCOUNTER — Other Ambulatory Visit: Payer: Self-pay | Admitting: Physician Assistant

## 2021-05-16 ENCOUNTER — Other Ambulatory Visit: Payer: Self-pay | Admitting: Dermatology

## 2021-06-14 ENCOUNTER — Other Ambulatory Visit: Payer: Self-pay | Admitting: Physician Assistant

## 2021-07-05 ENCOUNTER — Other Ambulatory Visit: Payer: Self-pay

## 2021-07-05 ENCOUNTER — Ambulatory Visit: Payer: Self-pay

## 2021-07-05 ENCOUNTER — Ambulatory Visit: Payer: 59 | Admitting: Family Medicine

## 2021-07-05 VITALS — Ht 62.0 in | Wt 165.0 lb

## 2021-07-05 DIAGNOSIS — M25512 Pain in left shoulder: Secondary | ICD-10-CM | POA: Diagnosis not present

## 2021-07-05 MED ORDER — METHYLPREDNISOLONE ACETATE 40 MG/ML IJ SUSP
40.0000 mg | Freq: Once | INTRAMUSCULAR | Status: AC
  Administered 2021-07-05: 40 mg via INTRA_ARTICULAR

## 2021-07-05 NOTE — Patient Instructions (Signed)
You have rotator cuff impingement of your left shoulder. Try to avoid painful activities (overhead activities, lifting with extended arm) as much as possible. Aleve 2 tabs twice a day with food OR ibuprofen 3 tabs three times a day with food for pain and inflammation as needed. Can take tylenol in addition to this. Subacromial injection may be beneficial to help with pain and to decrease inflammation - you were given this today. Consider physical therapy with transition to home exercise program. Do home exercise program with theraband and scapular stabilization exercises daily 3 sets of 10 once a day - wait about 5 days before restarting these. If not improving at follow-up we will consider further imaging, physical therapy, and/or nitro patches. Follow up with me in 6 weeks but call me sooner if you're struggling and would recommend physical therapy.

## 2021-07-05 NOTE — Progress Notes (Signed)
PCP: Toma Deiters, MD  Subjective:   HPI: Patient is a 50 y.o. female here for follow up for  left shoulder pain.  1/18: Left shoulder pain: Patient also notes that she has started having pain in her left shoulder.  She has similar issue in her right shoulder many years ago but over the last couple of months she has had progressive worsening of pain in her left shoulder, especially with internal rotation such as when she is reaching to undo her bra strap.  She also notes pain when she sleeps on her left side at night.  No numbness or tingling, no weakness.   3/14: Patient reports still having difficulty with left lateral shoulder pain. No new injuries. Pain worse when reaching back, trying to fasten bra. Some benefit with home exercise program. Associated throbbing in left trapezius area. Some night pain. Using topical medication. No numbness/tingling in left upper extremity.  8/29: Continues to endorse left lateral shoulder pain.  Received subacromial injection at her last appointment 3/14 with minimal pain relief. Today she also reports pain in her right trapezius.  She has been getting massages periodically without sustained pain relief.  States that she will soon try cupping for pain relief. Unable to sleep on her left shoulder at night due to pain. Previously provided with home PT exercises but she has not performed them. She is no longer able to wear bras that fasten the back due to pain with extension of the left shoulder. Denies numbness/tingling in her left arm.  Also reports intermittent right heel pain today.  She is currently not experiencing pain in her right heel but reports that since her last appointment in March she has occasionally had pain in her right heel.  Minimal to associate with any particular pattern of activity that brings on the pain or relieves it.  Denies pain in her foot in the morning when getting out of bed.  States that her masseuse told her she may  have plantar fasciitis.  Her prior medical history is significant for a right ankle injury in December 2021.  Past Medical History:  Diagnosis Date   ADD (attention deficit disorder)    Anemia    Anxiety    Degenerative disc disease, cervical    Hyperglycemia    Hypertension    Obesity     Current Outpatient Medications on File Prior to Visit  Medication Sig Dispense Refill   cetirizine (ZYRTEC) 10 MG tablet Take 10 mg by mouth daily.     fenofibrate (TRICOR) 145 MG tablet Take 145 mg by mouth daily.     finasteride (PROSCAR) 5 MG tablet Take 5 mg by mouth daily.     gabapentin (NEURONTIN) 400 MG capsule Take 1,200 mg by mouth at bedtime.      HYDROcodone-acetaminophen (NORCO/VICODIN) 5-325 MG tablet Take 1 tablet by mouth.     lisinopril (PRINIVIL,ZESTRIL) 40 MG tablet Take 40 mg by mouth daily.      metFORMIN (GLUCOPHAGE-XR) 500 MG 24 hr tablet Take 1 tablet by mouth daily.     minoxidil (LONITEN) 2.5 MG tablet Take 1/2 pill qd 90 tablet 1   omeprazole (PRILOSEC) 40 MG capsule Take 40 mg by mouth daily.     oxybutynin (DITROPAN-XL) 5 MG 24 hr tablet Take 5 mg by mouth daily.     spironolactone (ALDACTONE) 100 MG tablet TAKE ONE TABLET EVERY MORNING 30 tablet 4   spironolactone (ALDACTONE) 25 MG tablet TAKE TWO TABLETS BY MOUTH TWICE DAILY  60 tablet 5   tiZANidine (ZANAFLEX) 4 MG tablet Take 4 mg by mouth.     tolterodine (DETROL LA) 4 MG 24 hr capsule Take 4 mg by mouth daily.     topiramate (TOPAMAX) 50 MG tablet Take 50 mg by mouth 2 (two) times daily.     valACYclovir (VALTREX) 1000 MG tablet Take 1,000 mg by mouth 2 (two) times daily.     No current facility-administered medications on file prior to visit.    Past Surgical History:  Procedure Laterality Date   CHOLECYSTECTOMY     OVARIAN CYST DRAINAGE     TONSILLECTOMY AND ADENOIDECTOMY      Allergies  Allergen Reactions   Metronidazole Swelling   Morphine Other (See Comments)   Propoxyphene Nausea And Vomiting    Clindamycin/Lincomycin     Social History   Socioeconomic History   Marital status: Single    Spouse name: Not on file   Number of children: Not on file   Years of education: Not on file   Highest education level: Not on file  Occupational History   Not on file  Tobacco Use   Smoking status: Never   Smokeless tobacco: Never  Vaping Use   Vaping Use: Never used  Substance and Sexual Activity   Alcohol use: No    Alcohol/week: 0.0 standard drinks   Drug use: No   Sexual activity: Not on file  Other Topics Concern   Not on file  Social History Narrative   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   Social Determinants of Health   Financial Resource Strain: Not on file  Food Insecurity: Not on file  Transportation Needs: Not on file  Physical Activity: Not on file  Stress: Not on file  Social Connections: Not on file  Intimate Partner Violence: Not on file    Family History  Problem Relation Age of Onset   Arthritis Mother    Prostate cancer Father     Ht 5\' 2"  (1.575 m)   Wt 165 lb (74.8 kg)   BMI 30.18 kg/m   [facility] Adult Exercise 07/05/2021  Frequency of aerobic exercise (# of days/week) 0  Average time in minutes 0  Frequency of strengthening activities (# of days/week) 0    No flowsheet data found.  Review of Systems: See HPI above.     Objective:  Physical Exam:  Gen: NAD, comfortable in exam room  L Shoulder No swelling, ecchymoses.  No gross deformity. No TTP. Significantly limited extension due to pain, pain elicited with internal rotation, experiences pain with abduction but able to fully abduct. Positive Hawkins, Neers. Negative Yergasons. Strength 5/5 with empty can and resisted external rotation. Negative apprehension. NV intact distally.   There is tenderness to palpation across the right trapezius. FROM of neck and R shoulder.   Complete MSK u/s left shoulder: Biceps tendon: intact with mild tenosynovitis Pec major  tendon: intact Subscapularis: intact without evidence impingement AC joint: minimal effusion.  Minimal arthropathy Infraspinatus: intact without abnormalities Supraspinatus: No visible tears.  Mild subacromial bursitis Posterior glenohumeral joint: no effusion, visible labral tear, arthropathy, paralabral cyst  Impression: Mild subacromial bursitis and biceps tenosynovitis.   Assessment & Plan:  1. Impingement syndrome of the left shoulder Seen today in follow-up for left shoulder pain 2/2 rotator cuff impingement.  Her symptoms and exam findings remain consistent with impingement syndrome.  Left shoulder ultrasound performed today demonstrating mild subacromial bursitis.  Treatment options reviewed.  She would  like to attempt subacromial injection again today and home PT exercises that she has previously been provided with - admitted to not doing these previously.  If these measures do not improve her symptoms, plan for further imaging, formal PT, and discussed starting nitro patches.  Follow-up in 6 weeks, sooner if not making any progress.  After informed written consent timeout was performed, patient was seated in chair in exam room. Left shoulder was prepped with alcohol swab and utilizing lateral approach with ultrasound guidance, patient's left subacromial space was injected with 3:1 lidocaine: depomedrol. Patient tolerated the procedure well without immediate complications.  Christel Mormon, MD PGY-3  Patient seen and examined with resident.  Agree with his note and findings.  I performed repeat subacromial injection noted above.

## 2021-07-06 ENCOUNTER — Encounter: Payer: Self-pay | Admitting: Family Medicine

## 2021-07-06 NOTE — Addendum Note (Signed)
Addended by: Lenda Kelp on: 07/06/2021 10:15 AM   Modules accepted: Level of Service

## 2021-07-15 ENCOUNTER — Emergency Department (HOSPITAL_COMMUNITY): Payer: 59 | Admitting: Certified Registered Nurse Anesthetist

## 2021-07-15 ENCOUNTER — Other Ambulatory Visit: Payer: Self-pay

## 2021-07-15 ENCOUNTER — Encounter (HOSPITAL_COMMUNITY)
Admission: EM | Disposition: A | Discharge status: Home / Self Care | Payer: Self-pay | Source: Home / Self Care | Attending: Emergency Medicine

## 2021-07-15 ENCOUNTER — Ambulatory Visit (HOSPITAL_COMMUNITY)
Admission: EM | Admit: 2021-07-15 | Discharge: 2021-07-15 | Disposition: A | Discharge status: Home / Self Care | Payer: 59

## 2021-07-15 ENCOUNTER — Observation Stay (HOSPITAL_COMMUNITY)
Admission: EM | Admit: 2021-07-15 | Discharge: 2021-07-16 | Disposition: A | Discharge status: Home / Self Care | Payer: 59 | Attending: Surgery | Admitting: Surgery

## 2021-07-15 ENCOUNTER — Emergency Department (HOSPITAL_COMMUNITY): Payer: 59

## 2021-07-15 DIAGNOSIS — Z8616 Personal history of COVID-19: Secondary | ICD-10-CM | POA: Diagnosis not present

## 2021-07-15 DIAGNOSIS — K3580 Unspecified acute appendicitis: Principal | ICD-10-CM | POA: Insufficient documentation

## 2021-07-15 DIAGNOSIS — Z79899 Other long term (current) drug therapy: Secondary | ICD-10-CM | POA: Insufficient documentation

## 2021-07-15 DIAGNOSIS — I10 Essential (primary) hypertension: Secondary | ICD-10-CM | POA: Insufficient documentation

## 2021-07-15 DIAGNOSIS — Z20822 Contact with and (suspected) exposure to covid-19: Secondary | ICD-10-CM | POA: Insufficient documentation

## 2021-07-15 DIAGNOSIS — Z7984 Long term (current) use of oral hypoglycemic drugs: Secondary | ICD-10-CM | POA: Diagnosis not present

## 2021-07-15 DIAGNOSIS — R1031 Right lower quadrant pain: Secondary | ICD-10-CM | POA: Diagnosis present

## 2021-07-15 HISTORY — PX: LAPAROSCOPIC APPENDECTOMY: SHX408

## 2021-07-15 LAB — CBC
HCT: 40.8 % (ref 36.0–46.0)
Hemoglobin: 13.7 g/dL (ref 12.0–15.0)
MCH: 33 pg (ref 26.0–34.0)
MCHC: 33.6 g/dL (ref 30.0–36.0)
MCV: 98.3 fL (ref 80.0–100.0)
Platelets: 420 10*3/uL — ABNORMAL HIGH (ref 150–400)
RBC: 4.15 MIL/uL (ref 3.87–5.11)
RDW: 12.1 % (ref 11.5–15.5)
WBC: 19.3 10*3/uL — ABNORMAL HIGH (ref 4.0–10.5)
nRBC: 0 % (ref 0.0–0.2)

## 2021-07-15 LAB — URINALYSIS, ROUTINE W REFLEX MICROSCOPIC
Bilirubin Urine: NEGATIVE
Glucose, UA: NEGATIVE mg/dL
Hgb urine dipstick: NEGATIVE
Ketones, ur: NEGATIVE mg/dL
Leukocytes,Ua: NEGATIVE
Nitrite: NEGATIVE
Protein, ur: NEGATIVE mg/dL
Specific Gravity, Urine: <1.005 — ABNORMAL LOW (ref 1.005–1.030)
pH: 6 (ref 5.0–8.0)

## 2021-07-15 LAB — COMPREHENSIVE METABOLIC PANEL
ALT: 79 U/L — ABNORMAL HIGH (ref 0–44)
AST: 72 U/L — ABNORMAL HIGH (ref 15–41)
Albumin: 4.6 g/dL (ref 3.5–5.0)
Alkaline Phosphatase: 51 U/L (ref 38–126)
Anion gap: 12 (ref 5–15)
BUN: 11 mg/dL (ref 6–20)
CO2: 20 mmol/L — ABNORMAL LOW (ref 22–32)
Calcium: 10.1 mg/dL (ref 8.9–10.3)
Chloride: 106 mmol/L (ref 98–111)
Creatinine, Ser: 1.05 mg/dL — ABNORMAL HIGH (ref 0.44–1.00)
GFR, Estimated: >60 mL/min (ref >60)
Glucose, Bld: 123 mg/dL — ABNORMAL HIGH (ref 70–99)
Potassium: 3.6 mmol/L (ref 3.5–5.1)
Sodium: 138 mmol/L (ref 135–145)
Total Bilirubin: 1 mg/dL (ref 0.3–1.2)
Total Protein: 8 g/dL (ref 6.5–8.1)

## 2021-07-15 LAB — I-STAT BETA HCG BLOOD, ED (MC, WL, AP ONLY): I-stat hCG, quantitative: 5.2 m[IU]/mL — ABNORMAL HIGH (ref <5)

## 2021-07-15 LAB — PREGNANCY, URINE: Preg Test, Ur: NEGATIVE

## 2021-07-15 LAB — LACTIC ACID, PLASMA: Lactic Acid, Venous: 1.2 mmol/L (ref 0.5–1.9)

## 2021-07-15 LAB — LIPASE, BLOOD: Lipase: 25 U/L (ref 11–51)

## 2021-07-15 LAB — RESP PANEL BY RT-PCR (FLU A&B, COVID) ARPGX2
Influenza A by PCR: NEGATIVE
Influenza B by PCR: NEGATIVE
SARS Coronavirus 2 by RT PCR: NEGATIVE

## 2021-07-15 SURGERY — APPENDECTOMY, LAPAROSCOPIC
Anesthesia: General | Site: Abdomen

## 2021-07-15 MED ORDER — SODIUM CHLORIDE 0.9 % IV BOLUS (SEPSIS)
250.0000 mL | Freq: Once | INTRAVENOUS | Status: AC
Start: 2021-07-15 — End: 2021-07-15
  Administered 2021-07-15: 250 mL via INTRAVENOUS

## 2021-07-15 MED ORDER — IOHEXOL 350 MG/ML SOLN
100.0000 mL | Freq: Once | INTRAVENOUS | Status: AC | PRN
  Administered 2021-07-15: 100 mL via INTRAVENOUS

## 2021-07-15 MED ORDER — LACTATED RINGERS IV SOLN: INTRAVENOUS | Status: DC

## 2021-07-15 MED ORDER — OXYCODONE HCL 5 MG PO TABS: 5.0000 mg | ORAL_TABLET | Freq: Once | ORAL | Status: DC | PRN

## 2021-07-15 MED ORDER — DIPHENHYDRAMINE HCL 50 MG/ML IJ SOLN
12.5000 mg | Freq: Once | INTRAMUSCULAR | Status: AC | PRN
  Administered 2021-07-15: 12.5 mg via INTRAVENOUS

## 2021-07-15 MED ORDER — SUGAMMADEX SODIUM 200 MG/2ML IV SOLN
INTRAVENOUS | Status: DC | PRN
  Administered 2021-07-15: 200 mg via INTRAVENOUS

## 2021-07-15 MED ORDER — PROPOFOL 10 MG/ML IV BOLUS
INTRAVENOUS | Status: DC | PRN
  Administered 2021-07-15: 150 mg via INTRAVENOUS
  Administered 2021-07-15: 30 mg via INTRAVENOUS

## 2021-07-15 MED ORDER — ONDANSETRON HCL 4 MG/2ML IJ SOLN
4.0000 mg | Freq: Once | INTRAMUSCULAR | Status: AC
Start: 2021-07-15 — End: 2021-07-15
  Administered 2021-07-15: 4 mg via INTRAVENOUS
  Filled 2021-07-15: qty 2

## 2021-07-15 MED ORDER — PIPERACILLIN-TAZOBACTAM 3.375 G IVPB: 3.3750 g | Freq: Three times a day (TID) | INTRAVENOUS | Status: DC

## 2021-07-15 MED ORDER — PIPERACILLIN-TAZOBACTAM 3.375 G IVPB 30 MIN
3.3750 g | Freq: Once | INTRAVENOUS | Status: AC
  Administered 2021-07-15: 3.375 g via INTRAVENOUS
  Filled 2021-07-15: qty 50

## 2021-07-15 MED ORDER — DIPHENHYDRAMINE HCL 50 MG/ML IJ SOLN
INTRAMUSCULAR | Status: AC
  Filled 2021-07-15: qty 1

## 2021-07-15 MED ORDER — SUCCINYLCHOLINE CHLORIDE 200 MG/10ML IV SOSY
PREFILLED_SYRINGE | INTRAVENOUS | Status: DC | PRN
  Administered 2021-07-15: 120 mg via INTRAVENOUS

## 2021-07-15 MED ORDER — OXYCODONE HCL 5 MG/5ML PO SOLN: 5.0000 mg | Freq: Once | ORAL | Status: DC | PRN

## 2021-07-15 MED ORDER — HYDROMORPHONE HCL 1 MG/ML IJ SOLN
0.5000 mg | Freq: Once | INTRAMUSCULAR | Status: AC
  Administered 2021-07-15: 0.5 mg via INTRAVENOUS
  Filled 2021-07-15: qty 1

## 2021-07-15 MED ORDER — MIDAZOLAM HCL 2 MG/2ML IJ SOLN
INTRAMUSCULAR | Status: AC
  Filled 2021-07-15: qty 2

## 2021-07-15 MED ORDER — FENTANYL CITRATE (PF) 100 MCG/2ML IJ SOLN
25.0000 ug | INTRAMUSCULAR | Status: DC | PRN
  Administered 2021-07-15 (×2): 50 ug via INTRAVENOUS

## 2021-07-15 MED ORDER — ACETAMINOPHEN 650 MG RE SUPP: 650.0000 mg | Freq: Once | RECTAL | Status: DC

## 2021-07-15 MED ORDER — 0.9 % SODIUM CHLORIDE (POUR BTL) OPTIME
TOPICAL | Status: DC | PRN
  Administered 2021-07-15: 1000 mL

## 2021-07-15 MED ORDER — ACETAMINOPHEN 160 MG/5ML PO SOLN: 1000.0000 mg | Freq: Once | ORAL | Status: DC | PRN

## 2021-07-15 MED ORDER — ACETAMINOPHEN 500 MG PO TABS: 1000.0000 mg | ORAL_TABLET | Freq: Once | ORAL | Status: DC | PRN

## 2021-07-15 MED ORDER — SODIUM CHLORIDE 0.9 % IV BOLUS (SEPSIS)
1000.0000 mL | Freq: Once | INTRAVENOUS | Status: AC
  Administered 2021-07-15: 1000 mL via INTRAVENOUS

## 2021-07-15 MED ORDER — FENTANYL CITRATE (PF) 100 MCG/2ML IJ SOLN
INTRAMUSCULAR | Status: DC | PRN
  Administered 2021-07-15 (×2): 50 ug via INTRAVENOUS
  Administered 2021-07-15: 150 ug via INTRAVENOUS

## 2021-07-15 MED ORDER — BUPIVACAINE-EPINEPHRINE 0.25% -1:200000 IJ SOLN
INTRAMUSCULAR | Status: DC | PRN
  Administered 2021-07-15: 30 mL

## 2021-07-15 MED ORDER — ACETAMINOPHEN 10 MG/ML IV SOLN
INTRAVENOUS | Status: AC
  Filled 2021-07-15: qty 100

## 2021-07-15 MED ORDER — MIDAZOLAM HCL 5 MG/5ML IJ SOLN
INTRAMUSCULAR | Status: DC | PRN
  Administered 2021-07-15: 2 mg via INTRAVENOUS

## 2021-07-15 MED ORDER — BUPIVACAINE-EPINEPHRINE (PF) 0.25% -1:200000 IJ SOLN
INTRAMUSCULAR | Status: AC
  Filled 2021-07-15: qty 30

## 2021-07-15 MED ORDER — SODIUM CHLORIDE 0.9 % IV BOLUS (SEPSIS)
1000.0000 mL | Freq: Once | INTRAVENOUS | Status: AC
Start: 2021-07-15 — End: 2021-07-15
  Administered 2021-07-15: 1000 mL via INTRAVENOUS

## 2021-07-15 MED ORDER — PROMETHAZINE HCL 25 MG/ML IJ SOLN: 6.2500 mg | INTRAMUSCULAR | Status: DC | PRN

## 2021-07-15 MED ORDER — ACETAMINOPHEN 325 MG PO TABS
650.0000 mg | ORAL_TABLET | Freq: Once | ORAL | Status: AC
  Administered 2021-07-15: 650 mg via ORAL
  Filled 2021-07-15: qty 2

## 2021-07-15 MED ORDER — FENTANYL CITRATE (PF) 250 MCG/5ML IJ SOLN
INTRAMUSCULAR | Status: AC
  Filled 2021-07-15: qty 5

## 2021-07-15 MED ORDER — LIDOCAINE HCL (CARDIAC) PF 100 MG/5ML IV SOSY
PREFILLED_SYRINGE | INTRAVENOUS | Status: DC | PRN
  Administered 2021-07-15: 60 mg via INTRAVENOUS

## 2021-07-15 MED ORDER — PHENYLEPHRINE HCL (PRESSORS) 10 MG/ML IV SOLN
INTRAVENOUS | Status: DC | PRN
  Administered 2021-07-15: 80 ug via INTRAVENOUS

## 2021-07-15 MED ORDER — SODIUM CHLORIDE 0.9 % IV BOLUS: 1000.0000 mL | Freq: Once | INTRAVENOUS | Status: DC

## 2021-07-15 MED ORDER — HYDROMORPHONE HCL 1 MG/ML IJ SOLN
INTRAMUSCULAR | Status: AC
  Filled 2021-07-15: qty 1

## 2021-07-15 MED ORDER — FENTANYL CITRATE (PF) 100 MCG/2ML IJ SOLN
INTRAMUSCULAR | Status: AC
  Filled 2021-07-15: qty 2

## 2021-07-15 MED ORDER — ONDANSETRON HCL 4 MG/2ML IJ SOLN
INTRAMUSCULAR | Status: DC | PRN
  Administered 2021-07-15: 4 mg via INTRAVENOUS

## 2021-07-15 MED ORDER — DEXAMETHASONE SODIUM PHOSPHATE 10 MG/ML IJ SOLN
INTRAMUSCULAR | Status: DC | PRN
Start: 2021-07-15 — End: 2021-07-15
  Administered 2021-07-15: 10 mg via INTRAVENOUS

## 2021-07-15 MED ORDER — KETOROLAC TROMETHAMINE 30 MG/ML IJ SOLN
INTRAMUSCULAR | Status: DC | PRN
  Administered 2021-07-15: 30 mg via INTRAVENOUS

## 2021-07-15 MED ORDER — ACETAMINOPHEN 10 MG/ML IV SOLN
1000.0000 mg | Freq: Once | INTRAVENOUS | Status: DC | PRN
  Administered 2021-07-15: 1000 mg via INTRAVENOUS

## 2021-07-15 MED ORDER — SODIUM CHLORIDE 0.9 % IR SOLN
Status: DC | PRN
  Administered 2021-07-15: 1000 mL

## 2021-07-15 MED ORDER — HYDROMORPHONE HCL 1 MG/ML IJ SOLN
0.2500 mg | INTRAMUSCULAR | Status: DC | PRN
  Administered 2021-07-15: 0.5 mg via INTRAVENOUS

## 2021-07-15 MED ORDER — ROCURONIUM BROMIDE 100 MG/10ML IV SOLN
INTRAVENOUS | Status: DC | PRN
  Administered 2021-07-15: 40 mg via INTRAVENOUS

## 2021-07-15 SURGICAL SUPPLY — 40 items
APPLIER CLIP 5 13 M/L LIGAMAX5 (MISCELLANEOUS)
BAG COUNTER SPONGE SURGICOUNT (BAG) ×2 IMPLANT
CANISTER SUCT 3000ML PPV (MISCELLANEOUS) ×2 IMPLANT
CHLORAPREP W/TINT 26 (MISCELLANEOUS) ×2 IMPLANT
CLIP APPLIE 5 13 M/L LIGAMAX5 (MISCELLANEOUS) IMPLANT
CLIP VESOLOCK XL 6/CT (CLIP) ×2 IMPLANT
COVER SURGICAL LIGHT HANDLE (MISCELLANEOUS) ×2 IMPLANT
CUTTER FLEX LINEAR 45M (STAPLE) IMPLANT
DERMABOND ADVANCED (GAUZE/BANDAGES/DRESSINGS) ×1
DERMABOND ADVANCED .7 DNX12 (GAUZE/BANDAGES/DRESSINGS) ×1 IMPLANT
ELECT REM PT RETURN 9FT ADLT (ELECTROSURGICAL) ×2
ELECTRODE REM PT RTRN 9FT ADLT (ELECTROSURGICAL) ×1 IMPLANT
ENDOLOOP SUT PDS II  0 18 (SUTURE)
ENDOLOOP SUT PDS II 0 18 (SUTURE) IMPLANT
GLOVE SURG POLYISO LF SZ7 (GLOVE) ×2 IMPLANT
GLOVE SURG UNDER POLY LF SZ7 (GLOVE) ×2 IMPLANT
GOWN STRL REUS W/ TWL LRG LVL3 (GOWN DISPOSABLE) ×2 IMPLANT
GOWN STRL REUS W/TWL LRG LVL3 (GOWN DISPOSABLE) ×4
GRASPER SUT TROCAR 14GX15 (MISCELLANEOUS) ×2 IMPLANT
KIT BASIN OR (CUSTOM PROCEDURE TRAY) ×2 IMPLANT
KIT TURNOVER KIT B (KITS) ×2 IMPLANT
NEEDLE 22X1 1/2 (OR ONLY) (NEEDLE) ×2 IMPLANT
NS IRRIG 1000ML POUR BTL (IV SOLUTION) ×2 IMPLANT
PAD ARMBOARD 7.5X6 YLW CONV (MISCELLANEOUS) ×4 IMPLANT
POUCH RETRIEVAL ECOSAC 10 (ENDOMECHANICALS) ×1 IMPLANT
POUCH RETRIEVAL ECOSAC 10MM (ENDOMECHANICALS) ×2
RELOAD STAPLE TA45 3.5 REG BLU (ENDOMECHANICALS) IMPLANT
SCISSORS LAP 5X35 DISP (ENDOMECHANICALS) ×2 IMPLANT
SET IRRIG TUBING LAPAROSCOPIC (IRRIGATION / IRRIGATOR) ×2 IMPLANT
SET TUBE SMOKE EVAC HIGH FLOW (TUBING) ×2 IMPLANT
SLEEVE ENDOPATH XCEL 5M (ENDOMECHANICALS) ×2 IMPLANT
SPECIMEN JAR SMALL (MISCELLANEOUS) ×2 IMPLANT
SUT MNCRL AB 4-0 PS2 18 (SUTURE) ×2 IMPLANT
TOWEL GREEN STERILE (TOWEL DISPOSABLE) ×2 IMPLANT
TOWEL GREEN STERILE FF (TOWEL DISPOSABLE) ×2 IMPLANT
TRAY FOLEY W/BAG SLVR 14FR (SET/KITS/TRAYS/PACK) IMPLANT
TRAY LAPAROSCOPIC MC (CUSTOM PROCEDURE TRAY) ×2 IMPLANT
TROCAR XCEL 12X100 BLDLESS (ENDOMECHANICALS) ×2 IMPLANT
TROCAR XCEL NON-BLD 5MMX100MML (ENDOMECHANICALS) ×2 IMPLANT
WATER STERILE IRR 1000ML POUR (IV SOLUTION) ×2 IMPLANT

## 2021-07-15 NOTE — ED Notes (Signed)
Patient is being discharged from the Urgent Care and sent to the Emergency Department via POV . Per Chales Salmon, NP, patient is in need of higher level of care due to need of further evaluation. Patient is aware and verbalizes understanding of plan of care.  Vitals:   07/15/21 1359  BP: (!) 141/101  Pulse: (!) 140  Resp: 18  Temp: (!) 101.1 F (38.4 C)  SpO2: 96%

## 2021-07-15 NOTE — ED Provider Notes (Signed)
[facility] EMERGENCY DEPARTMENT Provider Note   CSN: [id number] Arrival date & time: 07/15/21  1415     History Chief Complaint  Patient presents with   Abdominal Pain    Desiree Carter is a 50 y.o. female with a past medical history significant for hypertension, anxiety, and ADHD who presents to the ED due to right lower quadrant abdominal pain x3 days associated with nausea.  Patient also admits to some constipation.  Last normal bowel movement was last week.  She admits to a small bowel movement earlier today.  Denies emesis however, admits to nausea.  She is had a previous cholecystectomy and ovarian surgery.  She has an intact appendix.  Denies urinary vaginal symptoms.  She was last sexually active in 2016.  No concern for STDs.  Admits to intermittent subjective fevers at home last night.  No diarrhea. No chest pain or shortness of breath. She has not tried anything for her symptoms.  No aggravating or alleviating factors.  History obtained from patient and past medical records. No interpreter used during encounter.       Past Medical History:  Diagnosis Date   ADD (attention deficit disorder)    Anemia    Anxiety    Degenerative disc disease, cervical    Hyperglycemia    Hypertension    Obesity     Patient Active Problem List   Diagnosis Date Noted   Educated about COVID-19 virus infection 02/05/2019   Cold feet 12/28/2018   Decreased pedal pulses 12/28/2018   SOB (shortness of breath) 12/28/2018   Tachycardia 12/28/2018   Chronic venous insufficiency 01/01/2016   Skin lesion 11/23/2015   Degenerative disc disease, cervical 10/29/2015   ADD (attention deficit disorder) 09/29/2015   Anemia, unspecified 09/01/2015   Anxiety, generalized 09/01/2015   Chronic allergic rhinitis 09/01/2015   Chronic bilateral low back pain without sciatica 09/01/2015   Elevated hemoglobin A1c 09/01/2015   Essential hypertension 09/01/2015   Moderate cervical  dysplasia, histologically confirmed 07/20/2015   C. difficile diarrhea 04/07/2015   Irritable bowel syndrome with diarrhea 04/07/2015   Acquired equinus deformity of both feet 12/25/2014   Plantar fasciitis, bilateral 12/25/2014   Sesamoiditis 12/25/2014   Tendonitis of ankle or foot 12/25/2014   Gastroesophageal reflux disease without esophagitis 11/03/2014   Vaginal irritation 09/23/2014   Vaginitis and vulvovaginitis 09/02/2014   Dermatitis seborrheica 08/11/2014   Elevated glucose 06/30/2014   Encounter for general adult medical examination without abnormal findings 06/30/2014   Obesity (BMI 30-39.9) 06/30/2014   Great toe pain, left 04/21/2014   Insomnia 03/18/2014   Restless leg syndrome 03/18/2014    Past Surgical History:  Procedure Laterality Date   CHOLECYSTECTOMY     OVARIAN CYST DRAINAGE     TONSILLECTOMY AND ADENOIDECTOMY       OB History   No obstetric history on file.     Family History  Problem Relation Age of Onset   Arthritis Mother    Prostate cancer Father     Social History   Tobacco Use   Smoking status: Never   Smokeless tobacco: Never  Vaping Use   Vaping Use: Never used  Substance Use Topics   Alcohol use: No    Alcohol/week: 0.0 standard drinks   Drug use: No    Home Medications Prior to Admission medications   Medication Sig Start Date End Date Taking? Authorizing Provider  cholecalciferol (VITAMIN D3) 25 MCG (1000 UNIT) tablet Take 1,000 Units by mouth every  evening.   Yes [provider]  fenofibrate (TRICOR) 145 MG tablet Take 145 mg by mouth daily.   Yes [provider]  finasteride (PROSCAR) 5 MG tablet Take 5 mg by mouth daily. 10/14/20  Yes [provider]  gabapentin (NEURONTIN) 400 MG capsule Take 1,200 mg by mouth at bedtime.  11/27/18  Yes [provider]  Garlic 601 MG TBEC Take 1 tablet by mouth every evening.   Yes [provider]  HYDROcodone-acetaminophen (NORCO) 10-325 MG  tablet Take 1 tablet by mouth 3 (three) times daily as needed for moderate pain or severe pain. 06/14/21  Yes [provider]  metFORMIN (GLUCOPHAGE-XR) 500 MG 24 hr tablet Take 1 tablet by mouth daily. 01/30/19  Yes [provider]  minoxidil (LONITEN) 2.5 MG tablet Take 1/2 pill qd Patient taking differently: Take 1.25 mg by mouth at bedtime. 03/31/21  Yes Sheffield, Vida Roller R, PA-C  multivitamin (VIT W/EXTRA C) CHEW chewable tablet Chew 1 tablet by mouth every evening.   Yes [provider]  omeprazole (PRILOSEC) 40 MG capsule Take 40 mg by mouth daily. 09/28/20  Yes [provider]  spironolactone (ALDACTONE) 100 MG tablet TAKE ONE TABLET EVERY MORNING 06/15/21  Yes Sheffield, Kelli R, PA-C  tiZANidine (ZANAFLEX) 4 MG tablet Take 4 mg by mouth at bedtime.   Yes [provider]  tolterodine (DETROL LA) 4 MG 24 hr capsule Take 4 mg by mouth daily. 02/18/21  Yes [provider]  topiramate (TOPAMAX) 50 MG tablet Take 50 mg by mouth 2 (two) times daily. 03/09/20  Yes [provider]  valACYclovir (VALTREX) 1000 MG tablet Take 1,000 mg by mouth daily. 10/15/20  Yes [provider]  spironolactone (ALDACTONE) 25 MG tablet TAKE TWO TABLETS BY MOUTH TWICE DAILY Patient not taking: Reported on 07/15/2021 10/27/20   Lavonna Monarch, MD    Allergies    Metronidazole, Morphine, Propoxyphene, and Clindamycin/lincomycin  Review of Systems   Review of Systems  Constitutional:  Positive for chills and fever.  Respiratory:  Negative for shortness of breath.   Cardiovascular:  Negative for chest pain.  Gastrointestinal:  Positive for abdominal pain, constipation and nausea. Negative for diarrhea and vomiting.   Physical Exam Updated Vital Signs BP 106/70   Pulse (!) 108   Temp 98.9 F (37.2 C) (Oral)   Resp 12   Ht _0  (1.575 m)   Wt 74.8 kg   SpO2 100%   BMI 30.18 kg/m   Physical Exam Vitals and nursing note reviewed.   Constitutional:      General: She is not in acute distress.    Appearance: She is not ill-appearing.  HENT:     Head: Normocephalic.  Eyes:     Pupils: Pupils are equal, round, and reactive to light.  Cardiovascular:     Rate and Rhythm: Normal rate and regular rhythm.     Pulses: Normal pulses.     Heart sounds: Normal heart sounds. No murmur heard.   No friction rub. No gallop.  Pulmonary:     Effort: Pulmonary effort is normal.     Breath sounds: Normal breath sounds.  Abdominal:     General: Abdomen is flat. There is no distension.     Palpations: Abdomen is soft.     Tenderness: There is abdominal tenderness. There is no guarding or rebound.     Comments: RLQ tednerness without rebound. Mild LLQ tenderness  Musculoskeletal:        General:  Normal range of motion.     Cervical back: Neck supple.  Skin:    General: Skin is warm and dry.  Neurological:     General: No focal deficit present.     Mental Status: She is alert.  Psychiatric:        Mood and Affect: Mood normal.        Behavior: Behavior normal.    ED Results / Procedures / Treatments   Labs (all labs ordered are listed, but only abnormal results are displayed) Labs Reviewed  COMPREHENSIVE METABOLIC PANEL - Abnormal; Notable for the following components:      Result Value   CO2 20 (*)    Glucose, Bld 123 (*)    Creatinine, Ser 1.05 (*)    AST 72 (*)    ALT 79 (*)    All other components within normal limits  CBC - Abnormal; Notable for the following components:   WBC 19.3 (*)    Platelets 420 (*)    All other components within normal limits  URINALYSIS, ROUTINE W REFLEX MICROSCOPIC - Abnormal; Notable for the following components:   Specific Gravity, Urine <1.005 (*)    All other components within normal limits  I-STAT BETA HCG BLOOD, ED ([facility], [facility], [facility] ONLY) - Abnormal; Notable for the following components:   I-stat hCG, quantitative 5.2 (*)    All other components within normal limits  RESP PANEL BY  RT-PCR (FLU A&B, COVID) ARPGX2  CULTURE, BLOOD (ROUTINE X 2)  CULTURE, BLOOD (ROUTINE X 2)  LIPASE, BLOOD  LACTIC ACID, PLASMA  PREGNANCY, URINE    EKG None  Radiology CT ABDOMEN PELVIS W CONTRAST  Result Date: 07/15/2021 CLINICAL DATA:  Right lower quadrant abdominal pain since Tuesday. EXAM: CT ABDOMEN AND PELVIS WITH CONTRAST TECHNIQUE: Multidetector CT imaging of the abdomen and pelvis was performed using the standard protocol following bolus administration of intravenous contrast. CONTRAST:  140m OMNIPAQUE IOHEXOL 350 MG/ML SOLN COMPARISON:  May 06, 2008 FINDINGS: Lower chest: No acute abnormality. Hepatobiliary: Diffuse hepatic steatosis. Gallbladder surgically absent. Prominence of the intra and extrahepatic biliary tree favor reservoir effect post cholecystectomy. Pancreas: No evidence of acute pancreatic inflammation or pancreatic ductal dilation. Spleen: Within normal limits. Adrenals/Urinary Tract: Adrenal glands are unremarkable. Kidneys are normal, without renal calculi, solid enhancing lesion, or hydronephrosis. Bladder is unremarkable for degree of distension. Stomach/Bowel: Stomach is unremarkable for degree of distension. No pathologic dilation of small or large bowel. Terminal ileum is unremarkable. Dilated fluid-filled appendix in the right hemipelvis measuring up to 12 mm with mucosal hyperenhancement and periappendiceal fluid. Vascular/Lymphatic: Aortic atherosclerosis without abdominal aortic aneurysm. No pathologically enlarged abdominal or pelvic lymph nodes. Reproductive: Uterus and right adnexa are unremarkable. Similar size of the left ovarian dermoid measuring 2.9 x 2.0 cm. Other: No walled off fluid collections.  No pneumoperitoneum. Musculoskeletal: L5-S1 discogenic disease. No acute osseous abnormality. IMPRESSION: 1. Acute uncomplicated appendicitis. 2. Diffuse hepatic steatosis. 3. Similar size of left ovarian dermoid measuring 2.9 cm. 4.  Aortic Atherosclerosis  (ICD10-I70.0). Electronically Signed   By: JDahlia BailiffM.D.   On: 07/15/2021 19:40   DG Chest Portable 1 View  Result Date: 07/15/2021 CLINICAL DATA:  Evaluate for free air. EXAM: PORTABLE CHEST 1 VIEW COMPARISON:  None. FINDINGS: The heart size and mediastinal contours are within normal limits. Both lungs are clear. The visualized skeletal structures are unremarkable. There is no free air visualized under the diaphragm. IMPRESSION: 1. No free air visualized under the diaphragm. 2. No  acute cardiopulmonary process. Electronically Signed   By: Ronney Asters M.D.   On: 07/15/2021 19:01    Procedures .Critical Care Performed by: Suzy Bouchard, PA-C Authorized by: Suzy Bouchard, PA-C   Critical care provider statement:    Critical care time (minutes):  40   Critical care was necessary to treat or prevent imminent or life-threatening deterioration of the following conditions:  Sepsis   Critical care was time spent personally by me on the following activities:  Discussions with consultants, evaluation of patient's response to treatment, examination of patient, ordering and performing treatments and interventions, ordering and review of laboratory studies, ordering and review of radiographic studies, pulse oximetry, re-evaluation of patient's condition, obtaining history from patient or surrogate and review of old charts   I assumed direction of critical care for this patient from another provider in my specialty: no     Care discussed with: admitting provider     Medications Ordered in ED Medications  lactated ringers infusion (0 mLs Intravenous Hold 07/15/21 1803)  sodium chloride 0.9 % bolus 1,000 mL (0 mLs Intravenous Stopped 07/15/21 1925)    And  sodium chloride 0.9 % bolus 1,000 mL (has no administration in time range)    And  sodium chloride 0.9 % bolus 250 mL (has no administration in time range)  piperacillin-tazobactam (ZOSYN) IVPB 3.375 g (3.375 g Intravenous New Bag/Given  07/15/21 1802)    Followed by  piperacillin-tazobactam (ZOSYN) IVPB 3.375 g (has no administration in time range)  HYDROmorphone (DILAUDID) injection 0.5 mg (0.5 mg Intravenous Given 07/15/21 1809)  ondansetron (ZOFRAN) injection 4 mg (4 mg Intravenous Given 07/15/21 1802)  acetaminophen (TYLENOL) tablet 650 mg (650 mg Oral Given 07/15/21 1802)  iohexol (OMNIPAQUE) 350 MG/ML injection 100 mL (100 mLs Intravenous Contrast Given 07/15/21 1902)    ED Course  I have reviewed the triage vital signs and the nursing notes.  Pertinent labs & imaging results that were available during my care of the patient were reviewed by me and considered in my medical decision making (see chart for details).    MDM Rules/Calculators/A&P                          56 female presents to the ED due to right lower quadrant abdominal pain.  Upon arrival, patient febrile and tachycardic.  Patient waited over 3 hours prior to my initial evaluation due to long wait times.  Code sepsis initiated upon initial evaluation.  IV fluids and antibiotics started.  Patient nontoxic-appearing.  Patient in no acute distress.  Physical exam significant for right lower quadrant abdominal tenderness without rebound.  Labs ordered at triage.  Chest x-ray to for free air to rule out perforation.  CT abdomen ordered.  IV morphine and Zofran given.  Added lactic acid and blood cultures. Tylenol given. Discussed case with Dr. Darl Householder who evaluated patient at bedside and agrees with assessment and plan.   CBC significant for leukocytosis at 19.3.  CMP significant for elevated creatinine 1.05.  Hyperglycemia 123.  No anion gap.  Suspicion for DKA.  AST elevated 72 and ALT at 79.  Normal alk phos.  UA reassuring with no signs of infection.  No hematuria. CT abdomen personally reviewed which demonstrates: IMPRESSION:  1. Acute uncomplicated appendicitis.  2. Diffuse hepatic steatosis.  3. Similar size of left ovarian dermoid measuring 2.9 cm.  4.  Aortic  Atherosclerosis (ICD10-I70.0).   Discussed case with Dr. Kieth Brightly with  general surgery who agrees to admit patient. COVID negative. Patient last ate around 12:30PM. Final Clinical Impression(s) / ED Diagnoses Final diagnoses:  Acute appendicitis, unspecified acute appendicitis type    Rx / DC Orders ED Discharge Orders     None        Karie Kirks 07/15/21 1954    Drenda Freeze, MD 07/15/21 [phone]

## 2021-07-15 NOTE — ED Provider Notes (Signed)
Emergency Medicine Provider Triage Evaluation Note  Desiree Carter , a 50 y.o. female  was evaluated in triage.  Pt complains of abdominal pain since Tuesday. Complaining of RLQ, suprapubic, and R flank pain. Associated fever and constipation. Has tried Miralax without relief. Hx overactive bladder, no new urgency.  Review of Systems  Positive: Abdominal pain, fever, nausea, flank pain, constipation Negative: Diarrhea, vomiting, dysuria, hematuria  Physical Exam  BP (!) 135/95   Pulse (!) 136   Temp 99.9 F (37.7 C) (Oral)   Resp 18   Ht 5\' 2"  (1.575 m)   Wt 74.8 kg   SpO2 100%   BMI 30.18 kg/m  Gen:   Awake, no distress   Resp:  Normal effort  MSK:   Moves extremities without difficulty  Other:  Mild TTP of RLQ and R flank without rebound or guarding  Medical Decision Making  Medically screening exam initiated at 2:44 PM.  Appropriate orders placed.  Desiree Carter was informed that the remainder of the evaluation will be completed by another provider, this initial triage assessment does not replace that evaluation, and the importance of remaining in the ED until their evaluation is complete.     Virl Diamond 07/15/21 1452    [place], 09/14/21, MD 07/15/21 [phone]

## 2021-07-15 NOTE — ED Triage Notes (Signed)
C/O lower abdominal pain since Tuesday along nausea; and stated haven't had a good bowel movement x 1 week;endorsed hx of gallbladder removal and ovarian cyst,

## 2021-07-15 NOTE — Sepsis Progress Note (Signed)
[facility] following sepsis protocol 

## 2021-07-15 NOTE — Anesthesia Procedure Notes (Signed)
Procedure Name: Intubation Date/Time: 07/15/2021 9:57 PM Performed by: Sabriyah Wilcher T, CRNA Pre-anesthesia Checklist: Patient identified, Emergency Drugs available, Suction available and Patient being monitored Patient Re-evaluated:Patient Re-evaluated prior to induction Oxygen Delivery Method: Circle system utilized Preoxygenation: Pre-oxygenation with 100% oxygen Induction Type: IV induction, Rapid sequence and Cricoid Pressure applied Ventilation: Mask ventilation without difficulty Laryngoscope Size: Glidescope and 3 Grade View: Grade II Tube type: Oral Tube size: 7.5 mm Number of attempts: 1 Airway Equipment and Method: Stylet, Oral airway and Video-laryngoscopy Placement Confirmation: ETT inserted through vocal cords under direct vision, positive ETCO2 and breath sounds checked- equal and bilateral Secured at: 21 cm Tube secured with: Tape Dental Injury: Teeth and Oropharynx as per pre-operative assessment  Difficulty Due To: Difficulty was anticipated, Difficult Airway- due to anterior larynx and Difficult Airway- due to limited oral opening Future Recommendations: Recommend- induction with short-acting agent, and alternative techniques readily available

## 2021-07-15 NOTE — Anesthesia Preprocedure Evaluation (Signed)
Anesthesia Evaluation  Patient identified by MRN, date of birth, ID band Patient awake    Reviewed: Allergy & Precautions, NPO status , Patient's Chart, lab work & pertinent test results  History of Anesthesia Complications Negative for: history of anesthetic complications  Airway Mallampati: IV  TM Distance: <3 FB Neck ROM: Full    Dental  (+) Dental Advisory Given, Teeth Intact   Pulmonary neg pulmonary ROS,    breath sounds clear to auscultation       Cardiovascular hypertension, Pt. on medications (-) angina(-) Past MI and (-) CHF  Rhythm:Regular     Neuro/Psych PSYCHIATRIC DISORDERS Anxiety negative neurological ROS     GI/Hepatic Neg liver ROS, GERD  ,appendicitis   Endo/Other  negative endocrine ROS  Renal/GU Lab Results      Component                Value               Date                      CREATININE               1.05 (H)            07/15/2021                Musculoskeletal  (+) Arthritis ,   Abdominal   Peds  Hematology negative hematology ROS (+) Lab Results      Component                Value               Date                      WBC                      19.3 (H)            07/15/2021                HGB                      13.7                07/15/2021                HCT                      40.8                07/15/2021                MCV                      98.3                07/15/2021                PLT                      420 (H)             07/15/2021              Anesthesia Other Findings   Reproductive/Obstetrics  Anesthesia Physical Anesthesia Plan  ASA: 2  Anesthesia Plan: General   Post-op Pain Management:    Induction: Intravenous  PONV Risk Score and Plan: 3 and Ondansetron and Dexamethasone  Airway Management Planned: Oral ETT and Video Laryngoscope Planned  Additional Equipment: None  Intra-op Plan:    Post-operative Plan: Extubation in OR  Informed Consent: I have reviewed the patients History and Physical, chart, labs and discussed the procedure including the risks, benefits and alternatives for the proposed anesthesia with the patient or authorized representative who has indicated his/her understanding and acceptance.     Dental advisory given  Plan Discussed with: CRNA and Anesthesiologist  Anesthesia Plan Comments:         Anesthesia Quick Evaluation

## 2021-07-15 NOTE — ED Notes (Signed)
Surgeon at bedside.  

## 2021-07-15 NOTE — Transfer of Care (Signed)
Immediate Anesthesia Transfer of Care Note  Patient: Desiree Carter  Procedure(s) Performed: APPENDECTOMY LAPAROSCOPIC (Abdomen)  Patient Location: PACU  Anesthesia Type:General  Level of Consciousness: awake, alert  and oriented  Airway & Oxygen Therapy: Patient Spontanous Breathing and Patient connected to nasal cannula oxygen  Post-op Assessment: Report given to RN, Post -op Vital signs reviewed and stable and Patient moving all extremities  Post vital signs: Reviewed and stable  Last Vitals:  Vitals Value Taken Time  BP 133/82 07/15/21 2255  Temp    Pulse 100 07/15/21 2257  Resp 18 07/15/21 2257  SpO2 97 % 07/15/21 2257  Vitals shown include unvalidated device data.  Last Pain:  Vitals:   07/15/21 1925  TempSrc: Oral  PainSc:          Complications: No notable events documented.

## 2021-07-15 NOTE — Op Note (Signed)
Preoperative diagnosis: acute appendicitis with abscess  Postoperative diagnosis: Same   Procedure: laparoscopic appendectomy  Surgeon: Feliciana Rossetti, M.D.  Asst: none  Anesthesia: Gen.   Indications for procedure: Desiree Carter is a 50 y.o. female with symptoms of pain in right lower quadrant and nausea consistent with acute appendicitis. Confirmed by CT.  Description of procedure: The patient was brought into the operative suite, placed supine. Anesthesia was administered with endotracheal tube. The patient's left arm was tucked. All pressure points were offloaded by foam padding. The patient was prepped and draped in the usual sterile fashion.  A transverse incision was made to the left of the umbilicus and a 74mm trocar was [place]. Pneumoperitoneum was applied with high flow low pressure.  2 15mm trocars were placed, one in the suprapubic space, one in the LLQ, the periumbilical incision was then up-sized and a 33mm trocar placed in that space. A transversus abdominal block was placed on the left and right sides. Next, the patient was placed in trendelenberg, rotated to the left. The omentum was retracted cephalad. The cecum and appendix were identified. The appendix was inflamed without signs of perforation. The base of the appendix was dissected and a window through the mesoappendix was created with blunt dissection. On dissecting at the base a small amount of pus drained from the mesoappendix which was evacuated.Large Hem-o-lock clips were used to doubly ligate the base of the appendix and mesoappendix. The appendix was cut free with scissors.  The appendix was placed in a specimen bag. The pelvis and RLQ were irrigated. No purulence was seen in the pelvis. The appendix was removed via the 12 mm trocar. 0 vicryl was used to close the fascial defect. Pneumoperitoneum was removed, all trocars were removed. All incisions were closed with 4-0 monocryl subcuticular stitch. The patient woke from  anesthesia and was brought to PACU in stable condition.  Findings: acute appendicitis  Specimen: appendix  Blood loss: 30 ml  Local anesthesia: 30 ml Marcaine  Complications: none  Feliciana Rossetti, M.D. General, Bariatric, & Minimally Invasive Surgery [facility]

## 2021-07-15 NOTE — ED Notes (Signed)
This Rn gave report to anesthesia. Pt will be going to short stay 36. Signed consent with patient. Pt belongings including purse, shoes, scrub top and scrub pants going up with patient in labeled belonging bag.

## 2021-07-15 NOTE — ED Triage Notes (Signed)
Pt c/o lower center to rt abdominal pain since Tuesday with nausea. States took mirlax with small results. States pain when pushing in and sometimes walking. States having sensitive skin to lower back.

## 2021-07-15 NOTE — H&P (Signed)
Desiree Carter is an 50 y.o. female.   Chief Complaint: abdominal pain HPI: 50 yo female with 2 days of right lower abdominal pain. Pain came on randomly. It has been constant and worsening over the last 2 days. It radiates to the back. She has never had a similar pain. She took miralax and gas x without relief. It is associated with nausea but no vomiting or diarrhea.  Past Medical History:  Diagnosis Date   ADD (attention deficit disorder)    Anemia    Anxiety    Degenerative disc disease, cervical    Hyperglycemia    Hypertension    Obesity     Past Surgical History:  Procedure Laterality Date   CHOLECYSTECTOMY     OVARIAN CYST DRAINAGE     TONSILLECTOMY AND ADENOIDECTOMY      Family History  Problem Relation Age of Onset   Arthritis Mother    Prostate cancer Father    Social History:  reports that she has never smoked. She has never used smokeless tobacco. She reports that she does not drink alcohol and does not use drugs.  Allergies:  Allergies  Allergen Reactions   Metronidazole Swelling   Morphine Other (See Comments)   Propoxyphene Nausea And Vomiting   Clindamycin/Lincomycin Rash    (Not in a hospital admission)   Results for orders placed or performed during the hospital encounter of 07/15/21 (from the past 48 hour(s))  Urinalysis, Routine w reflex microscopic Urine, Clean Catch     Status: Abnormal   Collection Time: 07/15/21  2:51 PM  Result Value Ref Range   Color, Urine YELLOW YELLOW   APPearance CLEAR CLEAR   Specific Gravity, Urine <1.005 (L) 1.005 - 1.030   pH 6.0 5.0 - 8.0   Glucose, UA NEGATIVE NEGATIVE mg/dL   Hgb urine dipstick NEGATIVE NEGATIVE   Bilirubin Urine NEGATIVE NEGATIVE   Ketones, ur NEGATIVE NEGATIVE mg/dL   Protein, ur NEGATIVE NEGATIVE mg/dL   Nitrite NEGATIVE NEGATIVE   Leukocytes,Ua NEGATIVE NEGATIVE    Comment: Microscopic not done on urines with negative protein, blood, leukocytes, nitrite, or glucose < 500  mg/dL. Performed at [facility], [address] [place], [place] [address]   Lipase, blood     Status: None   Collection Time: 07/15/21  2:54 PM  Result Value Ref Range   Lipase 25 11 - 51 U/L    Comment: Performed at [facility], [address] [place], [place] [address]  Comprehensive metabolic panel     Status: Abnormal   Collection Time: 07/15/21  2:54 PM  Result Value Ref Range   Sodium 138 135 - 145 mmol/L   Potassium 3.6 3.5 - 5.1 mmol/L   Chloride 106 98 - 111 mmol/L   CO2 20 (L) 22 - 32 mmol/L   Glucose, Bld 123 (H) 70 - 99 mg/dL    Comment: Glucose reference range applies only to samples taken after fasting for at least 8 hours.   BUN 11 6 - 20 mg/dL   Creatinine, Ser 0.981.05 (H) 0.44 - 1.00 mg/dL   Calcium 11.910.1 8.9 - 14.710.3 mg/dL   Total Protein 8.0 6.5 - 8.1 g/dL   Albumin 4.6 3.5 - 5.0 g/dL   AST 72 (H) 15 - 41 U/L   ALT 79 (H) 0 - 44 U/L   Alkaline Phosphatase 51 38 - 126 U/L   Total Bilirubin 1.0 0.3 - 1.2 mg/dL   GFR, Estimated >82>60 >95>60 mL/min  Comment: (NOTE) Calculated using the CKD-EPI Creatinine Equation (2021)    Anion gap 12 5 - 15    Comment: Performed at [facility], [address], [place], [place] [address]  CBC     Status: Abnormal   Collection Time: 07/15/21  2:54 PM  Result Value Ref Range   WBC 19.3 (H) 4.0 - 10.5 K/uL   RBC 4.15 3.87 - 5.11 MIL/uL   Hemoglobin 13.7 12.0 - 15.0 g/dL   HCT 41.5 83.0 - 94.0 %   MCV 98.3 80.0 - 100.0 fL   MCH 33.0 26.0 - 34.0 pg   MCHC 33.6 30.0 - 36.0 g/dL   RDW 76.8 08.8 - 11.0 %   Platelets 420 (H) 150 - 400 K/uL   nRBC 0.0 0.0 - 0.2 %    Comment: Performed at [facility], [address], [place], [place] [address]  I-Stat beta hCG blood, ED     Status: Abnormal   Collection Time: 07/15/21  3:22 PM  Result Value Ref Range   I-stat hCG, quantitative 5.2 (H) <5 mIU/mL   Comment 3            Comment:   GEST. AGE      CONC.  (mIU/mL)   <=1 WEEK        5 - 50     2 WEEKS       50  - 500     3 WEEKS       100 - 10,000     4 WEEKS     1,000 - 30,000        FEMALE AND NON-PREGNANT FEMALE:     LESS THAN 5 mIU/mL   Pregnancy, urine     Status: None   Collection Time: 07/15/21  4:48 PM  Result Value Ref Range   Preg Test, Ur NEGATIVE NEGATIVE    Comment:        THE SENSITIVITY OF THIS METHODOLOGY IS >20 mIU/mL. Performed at [facility], [address] [place], [place] [address]   Lactic acid, plasma     Status: None   Collection Time: 07/15/21  5:51 PM  Result Value Ref Range   Lactic Acid, Venous 1.2 0.5 - 1.9 mmol/L    Comment: Performed at [facility], [address] [place], [place] [address]  Resp Panel by RT-PCR (Flu A&B, Covid) Nasopharyngeal Swab     Status: None   Collection Time: 07/15/21  6:22 PM   Specimen: Nasopharyngeal Swab; Nasopharyngeal(NP) swabs in vial transport medium  Result Value Ref Range   SARS Coronavirus 2 by RT PCR NEGATIVE NEGATIVE    Comment: (NOTE) SARS-CoV-2 target nucleic acids are NOT DETECTED.  The SARS-CoV-2 RNA is generally detectable in upper respiratory specimens during the acute phase of infection. The lowest concentration of SARS-CoV-2 viral copies this assay can detect is 138 copies/mL. A negative result does not preclude SARS-Cov-2 infection and should not be used as the sole basis for treatment or other patient management decisions. A negative result may occur with  improper specimen collection/handling, submission of specimen other than nasopharyngeal swab, presence of viral mutation(s) within the areas targeted by this assay, and inadequate number of viral copies(<138 copies/mL). A negative result must be combined with clinical observations, patient history, and epidemiological information. The expected result is Negative.  Fact Sheet for Patients:  BloggerCourse.com  Fact Sheet for Healthcare Providers:  SeriousBroker.it  This test is no t yet  approved or cleared by the [place] and  has been authorized for detection and/or diagnosis of SARS-CoV-2 by FDA under an Emergency Use Authorization (EUA). This EUA will remain  in effect (meaning this test can be used) for the duration of the COVID-19 declaration under Section 564(b)(1) of the Act, 21 U.S.C.section 360bbb-3(b)(1), unless the authorization is terminated  or revoked sooner.       Influenza A by PCR NEGATIVE NEGATIVE   Influenza B by PCR NEGATIVE NEGATIVE    Comment: (NOTE) The Xpert Xpress SARS-CoV-2/FLU/RSV plus assay is intended as an aid in the diagnosis of influenza from Nasopharyngeal swab specimens and should not be used as a sole basis for treatment. Nasal washings and aspirates are unacceptable for Xpert Xpress SARS-CoV-2/FLU/RSV testing.  Fact Sheet for Patients: BloggerCourse.com  Fact Sheet for Healthcare Providers: SeriousBroker.it  This test is not yet approved or cleared by the [place] FDA and has been authorized for detection and/or diagnosis of SARS-CoV-2 by FDA under an Emergency Use Authorization (EUA). This EUA will remain in effect (meaning this test can be used) for the duration of the COVID-19 declaration under Section 564(b)(1) of the Act, 21 U.S.C. section 360bbb-3(b)(1), unless the authorization is terminated or revoked.  Performed at [facility], [address] [place], [place] [address]    CT ABDOMEN PELVIS W CONTRAST  Result Date: 07/15/2021 CLINICAL DATA:  Right lower quadrant abdominal pain since Tuesday. EXAM: CT ABDOMEN AND PELVIS WITH CONTRAST TECHNIQUE: Multidetector CT imaging of the abdomen and pelvis was performed using the standard protocol following bolus administration of intravenous contrast. CONTRAST:  OMNIPAQUE IOHEXOL 350 MG/ML SOLN COMPARISON:  May 06, 2008 FINDINGS: Lower chest: No acute abnormality. Hepatobiliary: Diffuse hepatic  steatosis. Gallbladder surgically absent. Prominence of the intra and extrahepatic biliary tree favor reservoir effect post cholecystectomy. Pancreas: No evidence of acute pancreatic inflammation or pancreatic ductal dilation. Spleen: Within normal limits. Adrenals/Urinary Tract: Adrenal glands are unremarkable. Kidneys are normal, without renal calculi, solid enhancing lesion, or hydronephrosis. Bladder is unremarkable for degree of distension. Stomach/Bowel: Stomach is unremarkable for degree of distension. No pathologic dilation of small or large bowel. Terminal ileum is unremarkable. Dilated fluid-filled appendix in the right hemipelvis measuring up to 12 mm with mucosal hyperenhancement and periappendiceal fluid. Vascular/Lymphatic: Aortic atherosclerosis without abdominal aortic aneurysm. No pathologically enlarged abdominal or pelvic lymph nodes. Reproductive: Uterus and right adnexa are unremarkable. Similar size of the left ovarian dermoid measuring 2.9 x 2.0 cm. Other: No walled off fluid collections.  No pneumoperitoneum. Musculoskeletal: L5-S1 discogenic disease. No acute osseous abnormality. IMPRESSION: 1. Acute uncomplicated appendicitis. 2. Diffuse hepatic steatosis. 3. Similar size of left ovarian dermoid measuring 2.9 cm. 4.  Aortic Atherosclerosis (ICD10-I70.0). Electronically Signed   By: Maudry Mayhew M.D.   On: 07/15/2021 19:40   DG Chest Portable 1 View  Result Date: 07/15/2021 CLINICAL DATA:  Evaluate for free air. EXAM: PORTABLE CHEST 1 VIEW COMPARISON:  None. FINDINGS: The heart size and mediastinal contours are within normal limits. Both lungs are clear. The visualized skeletal structures are unremarkable. There is no free air visualized under the diaphragm. IMPRESSION: 1. No free air visualized under the diaphragm. 2. No acute cardiopulmonary process. Electronically Signed   By: Darliss Cheney M.D.   On: 07/15/2021 19:01    Review of Systems  Constitutional:  Negative for chills and  fever.  HENT:  Negative for hearing loss.   Respiratory:  Negative for cough.   Cardiovascular:  Negative for chest pain and palpitations.  Gastrointestinal:  Positive for abdominal pain and nausea. Negative for vomiting.  Genitourinary:  Negative for dysuria and urgency.  Musculoskeletal:  Negative for myalgias and neck pain.  Skin:  Negative for rash.  Neurological:  Negative for dizziness and headaches.  Hematological:  Does not bruise/bleed easily.  Psychiatric/Behavioral:  Negative for suicidal ideas.    Blood pressure 110/78, pulse (!) 101, temperature 98.9 F (37.2 C), temperature source Oral, resp. rate 10, height 5\' 2"  (1.575 m), weight 74.8 kg, SpO2 100 %. Physical Exam Vitals and nursing note reviewed.  Constitutional:      Appearance: She is well-developed.  HENT:     Head: Normocephalic and atraumatic.  Eyes:     General: No scleral icterus.    Conjunctiva/sclera: Conjunctivae normal.  Cardiovascular:     Rate and Rhythm: Normal rate and regular rhythm.  Pulmonary:     Effort: Pulmonary effort is normal.     Breath sounds: Normal breath sounds. No wheezing or rales.  Chest:     Chest wall: No tenderness.  Abdominal:     General: There is no distension.     Palpations: Abdomen is soft.     Tenderness: There is abdominal tenderness in the right lower quadrant. There is no rebound.  Musculoskeletal:        General: Normal range of motion.     Cervical back: Normal range of motion and neck supple.  Skin:    General: Skin is warm and dry.  Neurological:     Mental Status: She is alert and oriented to person, place, and time.  Psychiatric:        Behavior: Behavior normal.   Assessment/Plan 50 yofemale with acute appendicitis. -IV abx -we discussed the details of the procedure; that it would be done under general anesthesia, that we would attempt to do the procedure laparoscopically. That the appendix would be isolated from the large and small intestine and then  ligated and removed. We discussed the reason for this is to avoid rupture and infection and resolve the pains. We discussed risks of infection, abscess, injury to intestines or urinary structures, and need for open incision. She showed good understanding and wanted to proceed. -obs stay unless perforated  , MD 07/15/2021, 8:49 PM

## 2021-07-15 NOTE — Progress Notes (Signed)
Pharmacy Antibiotic Note  Desiree Carter is a 50 y.o. female admitted on 07/15/2021 with  intra-abdominal infection .  Pharmacy has been consulted for zosyn dosing.  Patient with a history of hypertension, anxiety, and ADHD. Patient presenting with right lower quadrant abdominal pain x3 days associated with nausea.  SCr 1.05; WBC 19.3; LA 1.2  Plan: Zosyn 3.375g IV q8h (4 hour infusion). Trend WBC, Fever, infectious work-up, and renal function F/u cultures De-escalate as appropriate  Height: 5\' 2"  (157.5 cm) Weight: 74.8 kg (165 lb) IBW/kg (Calculated) : 50.1  Temp (24hrs), Avg:100.5 F (38.1 C), Min:99.9 F (37.7 C), Max:101.1 F (38.4 C)  Recent Labs  Lab 07/15/21 1454  WBC 19.3*  CREATININE 1.05*    Estimated Creatinine Clearance: 61.4 mL/min (A) (by C-G formula based on SCr of 1.05 mg/dL (H)).    Allergies  Allergen Reactions   Metronidazole Swelling   Morphine Other (See Comments)   Propoxyphene Nausea And Vomiting   Clindamycin/Lincomycin    Antimicrobials this admission: zosyn 9/8 >>   Microbiology results: Pending  Thank you for allowing pharmacy to be a part of this patient's care.  11/8, PharmD, BCPS 07/15/2021 7:41 PM ED Clinical Pharmacist -  [phone]

## 2021-07-15 NOTE — Progress Notes (Signed)
Pacu Nursing Note  Pt didn't tolerate IV Fentanyl well, she had a lot of itching. Needed to give IV Benadryl, which did help. Called Anesthesia and Dr Maple Hudson ordered Dilaudid IV for pt which she did better with.   Also, she has Oxycodone ordered and I mentioned this to Dr Maple Hudson after talking with patient. She normally takes Vicodin since Oxycodone makes her very nauseas and she has a lot of trouble with PONV.   Dr Maple Hudson suggested that pherhaps Diladid could be ordered PO if she does well with the IV Dilaudid. She did do well with the IV Dilaudid in Pacu and wanted to make sure it was documented if needed for later on the floor.

## 2021-07-16 ENCOUNTER — Encounter (HOSPITAL_COMMUNITY): Payer: Self-pay | Admitting: General Surgery

## 2021-07-16 LAB — CBC
HCT: 34.6 % — ABNORMAL LOW (ref 36.0–46.0)
Hemoglobin: 11.4 g/dL — ABNORMAL LOW (ref 12.0–15.0)
MCH: 32.6 pg (ref 26.0–34.0)
MCHC: 32.9 g/dL (ref 30.0–36.0)
MCV: 98.9 fL (ref 80.0–100.0)
Platelets: 357 10*3/uL (ref 150–400)
RBC: 3.5 MIL/uL — ABNORMAL LOW (ref 3.87–5.11)
RDW: 12.1 % (ref 11.5–15.5)
WBC: 18.7 10*3/uL — ABNORMAL HIGH (ref 4.0–10.5)
nRBC: 0 % (ref 0.0–0.2)

## 2021-07-16 LAB — COMPREHENSIVE METABOLIC PANEL
ALT: 73 U/L — ABNORMAL HIGH (ref 0–44)
AST: 71 U/L — ABNORMAL HIGH (ref 15–41)
Albumin: 3.6 g/dL (ref 3.5–5.0)
Alkaline Phosphatase: 52 U/L (ref 38–126)
Anion gap: 10 (ref 5–15)
BUN: 7 mg/dL (ref 6–20)
CO2: 18 mmol/L — ABNORMAL LOW (ref 22–32)
Calcium: 9.1 mg/dL (ref 8.9–10.3)
Chloride: 111 mmol/L (ref 98–111)
Creatinine, Ser: 0.92 mg/dL (ref 0.44–1.00)
GFR, Estimated: >60 mL/min (ref >60)
Glucose, Bld: 184 mg/dL — ABNORMAL HIGH (ref 70–99)
Potassium: 3.4 mmol/L — ABNORMAL LOW (ref 3.5–5.1)
Sodium: 139 mmol/L (ref 135–145)
Total Bilirubin: 1 mg/dL (ref 0.3–1.2)
Total Protein: 6.8 g/dL (ref 6.5–8.1)

## 2021-07-16 LAB — HIV ANTIBODY (ROUTINE TESTING W REFLEX): HIV Screen 4th Generation wRfx: NONREACTIVE

## 2021-07-16 MED ORDER — SIMETHICONE 80 MG PO CHEW: 40.0000 mg | CHEWABLE_TABLET | Freq: Four times a day (QID) | ORAL | Status: DC | PRN

## 2021-07-16 MED ORDER — POLYETHYLENE GLYCOL 3350 17 G PO PACK: 17.0000 g | PACK | Freq: Every day | ORAL | Status: DC | PRN

## 2021-07-16 MED ORDER — FINASTERIDE 5 MG PO TABS
5.0000 mg | ORAL_TABLET | Freq: Every day | ORAL | Status: DC
  Filled 2021-07-16: qty 1

## 2021-07-16 MED ORDER — ENOXAPARIN SODIUM 40 MG/0.4ML IJ SOSY: 40.0000 mg | PREFILLED_SYRINGE | INTRAMUSCULAR | Status: DC

## 2021-07-16 MED ORDER — FESOTERODINE FUMARATE ER 4 MG PO TB24
4.0000 mg | ORAL_TABLET | Freq: Every day | ORAL | Status: DC
  Filled 2021-07-16: qty 1

## 2021-07-16 MED ORDER — OXYCODONE HCL 5 MG PO TABS: 5.0000 mg | ORAL_TABLET | Freq: Four times a day (QID) | ORAL | Status: DC | PRN

## 2021-07-16 MED ORDER — SPIRONOLACTONE 100 MG PO TABS
100.0000 mg | ORAL_TABLET | Freq: Every morning | ORAL | Status: DC
  Filled 2021-07-16: qty 1

## 2021-07-16 MED ORDER — DIPHENHYDRAMINE HCL 50 MG/ML IJ SOLN: 25.0000 mg | Freq: Four times a day (QID) | INTRAMUSCULAR | Status: DC | PRN

## 2021-07-16 MED ORDER — TIZANIDINE HCL 2 MG PO TABS: 4.0000 mg | ORAL_TABLET | Freq: Every day | ORAL | Status: DC

## 2021-07-16 MED ORDER — GABAPENTIN 400 MG PO CAPS: 1200.0000 mg | ORAL_CAPSULE | Freq: Every day | ORAL | Status: DC

## 2021-07-16 MED ORDER — TOPIRAMATE 25 MG PO TABS
50.0000 mg | ORAL_TABLET | Freq: Two times a day (BID) | ORAL | Status: DC
  Filled 2021-07-16: qty 2

## 2021-07-16 MED ORDER — HYDROCODONE-ACETAMINOPHEN 10-325 MG PO TABS
1.0000 | ORAL_TABLET | Freq: Four times a day (QID) | ORAL | Status: DC | PRN
Start: 2021-07-16 — End: 2021-07-16
  Administered 2021-07-16: 1 via ORAL
  Filled 2021-07-16: qty 1

## 2021-07-16 MED ORDER — HYDROMORPHONE HCL 1 MG/ML IJ SOLN
0.5000 mg | INTRAMUSCULAR | Status: DC | PRN
Start: 2021-07-16 — End: 2021-07-16

## 2021-07-16 MED ORDER — ACETAMINOPHEN 325 MG PO TABS: 650.0000 mg | ORAL_TABLET | Freq: Four times a day (QID) | ORAL | Status: DC | PRN

## 2021-07-16 MED ORDER — FENOFIBRATE 54 MG PO TABS
54.0000 mg | ORAL_TABLET | Freq: Every day | ORAL | Status: DC
  Filled 2021-07-16: qty 1

## 2021-07-16 MED ORDER — DIPHENHYDRAMINE HCL 25 MG PO CAPS: 25.0000 mg | ORAL_CAPSULE | Freq: Four times a day (QID) | ORAL | Status: DC | PRN

## 2021-07-16 MED ORDER — VALACYCLOVIR HCL 500 MG PO TABS
1000.0000 mg | ORAL_TABLET | Freq: Every day | ORAL | Status: DC
  Filled 2021-07-16: qty 2

## 2021-07-16 MED ORDER — VITAMIN D 25 MCG (1000 UNIT) PO TABS: 1000.0000 [IU] | ORAL_TABLET | Freq: Every evening | ORAL | Status: DC

## 2021-07-16 MED ORDER — PANTOPRAZOLE SODIUM 40 MG PO TBEC
40.0000 mg | DELAYED_RELEASE_TABLET | Freq: Every day | ORAL | Status: DC
  Filled 2021-07-16: qty 1

## 2021-07-16 MED ORDER — METOPROLOL TARTRATE 5 MG/5ML IV SOLN
5.0000 mg | Freq: Four times a day (QID) | INTRAVENOUS | Status: DC | PRN
Start: 2021-07-16 — End: 2021-07-16

## 2021-07-16 MED ORDER — KCL IN DEXTROSE-NACL 20-5-0.45 MEQ/L-%-% IV SOLN
INTRAVENOUS | Status: DC
  Filled 2021-07-16: qty 1000

## 2021-07-16 MED ORDER — MINOXIDIL 2.5 MG PO TABS: 1.2500 mg | ORAL_TABLET | Freq: Every day | ORAL | Status: DC

## 2021-07-16 MED ORDER — ONDANSETRON HCL 4 MG/2ML IJ SOLN: 4.0000 mg | Freq: Four times a day (QID) | INTRAMUSCULAR | Status: DC | PRN

## 2021-07-16 MED ORDER — METFORMIN HCL ER 500 MG PO TB24: 500.0000 mg | ORAL_TABLET | Freq: Every day | ORAL | Status: DC

## 2021-07-16 MED ORDER — ACETAMINOPHEN 650 MG RE SUPP: 650.0000 mg | Freq: Four times a day (QID) | RECTAL | Status: DC | PRN

## 2021-07-16 MED ORDER — ONDANSETRON 4 MG PO TBDP: 4.0000 mg | ORAL_TABLET | Freq: Four times a day (QID) | ORAL | Status: DC | PRN

## 2021-07-16 NOTE — Discharge Instructions (Signed)
[facility]  Please arrive at least 30 min before your appointment to complete your check in paperwork.  If you are unable to arrive 30 min prior to your appointment time we may have to cancel or reschedule you. LAPAROSCOPIC SURGERY: POST OP INSTRUCTIONS Always review your discharge instruction sheet given to you by the facility where your surgery was performed. IF YOU HAVE DISABILITY OR FAMILY LEAVE FORMS, YOU MUST BRING THEM TO THE OFFICE FOR PROCESSING.   DO NOT GIVE THEM TO YOUR DOCTOR.  PAIN CONTROL  First take acetaminophen (Tylenol) AND/or ibuprofen (Advil) to control your pain after surgery.  Follow directions on package.  Taking acetaminophen (Tylenol) and/or ibuprofen (Advil) regularly after surgery will help to control your pain and lower the amount of prescription pain medication you may need.  You should not take more than 4,000 mg (4 grams) of acetaminophen (Tylenol) in 24 hours.  You should not take ibuprofen (Advil), aleve, motrin, naprosyn or other NSAIDS if you have a history of stomach ulcers or chronic kidney disease.  A prescription for pain medication may be given to you upon discharge.  Take your pain medication as prescribed, if you still have uncontrolled pain after taking acetaminophen (Tylenol) or ibuprofen (Advil). Use ice packs to help control pain. If you need a refill on your pain medication, please contact your pharmacy.  They will contact our office to request authorization. Prescriptions will not be filled after 5pm or on week-ends.  HOME MEDICATIONS Take your usually prescribed medications unless otherwise directed.  DIET You should follow a light diet the first few days after arrival home.  Be sure to include lots of fluids daily. Avoid fatty, fried foods.   CONSTIPATION It is common to experience some constipation after surgery and if you are taking pain medication.  Increasing fluid intake and taking a stool softener (such as Colace)  will usually help or prevent this problem from occurring.  A mild laxative (Milk of Magnesia or Miralax) should be taken according to package instructions if there are no bowel movements after 48 hours.  WOUND/INCISION CARE Most patients will experience some swelling and bruising in the area of the incisions.  Ice packs will help.  Swelling and bruising can take several days to resolve.  Unless discharge instructions indicate otherwise, follow guidelines below  STERI-STRIPS - you may remove your outer bandages 48 hours after surgery, and you may shower at that time.  You have steri-strips (small skin tapes) in place directly over the incision.  These strips should be left on the skin for 7-10 days.   DERMABOND/SKIN GLUE - you may shower in 24 hours.  The glue will flake off over the next 2-3 weeks. Any sutures or staples will be removed at the office during your follow-up visit.  ACTIVITIES You may resume regular (light) daily activities beginning the next day--such as daily self-care, walking, climbing stairs--gradually increasing activities as tolerated.  You may have sexual intercourse when it is comfortable.  Refrain from any heavy lifting or straining until approved by your doctor. You may drive when you are no longer taking prescription pain medication, you can comfortably wear a seatbelt, and you can safely maneuver your car and apply brakes.  FOLLOW-UP You should see your doctor in the office for a follow-up appointment approximately 2-3 weeks after your surgery.  You should have been given your post-op/follow-up appointment when your surgery was scheduled.  If you did not receive a post-op/follow-up appointment, make sure   that you call for this appointment within a day or two after you arrive home to insure a convenient appointment time.   WHEN TO CALL YOUR DOCTOR: Fever over 101.0 Inability to urinate Continued bleeding from incision. Increased pain, redness, or drainage from the  incision. Increasing abdominal pain  The clinic staff is available to answer your questions during regular business hours.  Please don't hesitate to call and ask to speak to one of the nurses for clinical concerns.  If you have a medical emergency, go to the nearest emergency room or call 911.  A surgeon from [facility] is always on call at the hospital. [address], [address], [place], [place]  [address] ? [address] [address], [place], [place]   [address] [phone] ? [phone] ? [phone]  

## 2021-07-16 NOTE — Progress Notes (Signed)
Patient discharged to home with instructions, answered questions and verbalized understanding. 

## 2021-07-16 NOTE — Progress Notes (Signed)
Received patient from PACU, AOx4, VSS, pain at 2/10, ambulatory, oriented to room, bed controls, and plan of care.  Patient ambulated to bathroom to void. Patient now resting bed comfortably watching TV.  Will monitor.

## 2021-07-16 NOTE — Discharge Summary (Signed)
Patient ID: Desiree Carter [id number] 12-Jan-1971 50 y.o.  Admit date: 07/15/2021 Discharge date: 07/16/2021  Admitting Diagnosis: acute appendicitis.  Discharge Diagnosis acute appendicitis with abscess  Consultants None   Reason for Admission: 50 yo female with 2 days of right lower abdominal pain. Pain came on randomly. It has been constant and worsening over the last 2 days. It radiates to the back. She has never had a similar pain. She took miralax and gas x without relief. It is associated with nausea but no vomiting or diarrhea.  Procedures Dr. Sheliah Carter - laparoscopic appendectomy - 07/15/2021  Hospital Course:  The patient was admitted and underwent a laparoscopic appendectomy.  The patient tolerated the procedure well. After discussion with surgeon, no further abx warranted post op. On POD 1, the patient was tolerating a regular diet, voiding well, mobilizing, and pain was controlled with oral pain medications.  The patient was stable for DC home at this time with appropriate follow up made. Return precautions discussed. Patient declined pain medication prescription stating she already has pain meds that she takes at home (Reports she is prescribed them by Dr. Olena Carter in ED, her PCP). She denies need for a work note.   Physical Exam: Gen:  Alert, NAD, pleasant HEENT: EOM's intact, pupils equal and round Card:  Reg Pulm:  CTAB, no W/R/R, effort normal Abd: Soft, ND, appropriately tender around laparoscopic incisions, Incisions with glue intact appears well and are without drainage, bleeding, or signs of infection Ext:  No LE edema  Psych: A&Ox3  Skin: no rashes noted, warm and dry  Allergies as of 07/16/2021       Reactions   Metronidazole Swelling   Morphine Other (See Comments)   Propoxyphene Nausea And Vomiting   Clindamycin/lincomycin Rash   Fentanyl Itching        Medication List     TAKE these medications    cholecalciferol 25 MCG (1000 UNIT)  tablet Commonly known as: VITAMIN D3 Take 1,000 Units by mouth every evening.   fenofibrate 145 MG tablet Commonly known as: TRICOR Take 145 mg by mouth daily.   finasteride 5 MG tablet Commonly known as: PROSCAR Take 5 mg by mouth daily.   gabapentin 400 MG capsule Commonly known as: NEURONTIN Take 1,200 mg by mouth at bedtime.   Garlic 400 MG Tbec Take 1 tablet by mouth every evening.   HYDROcodone-acetaminophen 10-325 MG tablet Commonly known as: NORCO Take 1 tablet by mouth 3 (three) times daily as needed for moderate pain or severe pain.   metFORMIN 500 MG 24 hr tablet Commonly known as: GLUCOPHAGE-XR Take 1 tablet by mouth daily.   minoxidil 2.5 MG tablet Commonly known as: LONITEN Take 1/2 pill qd What changed:  how much to take how to take this when to take this additional instructions   multivitamin Chew chewable tablet Chew 1 tablet by mouth every evening.   omeprazole 40 MG capsule Commonly known as: PRILOSEC Take 40 mg by mouth daily.   spironolactone 25 MG tablet Commonly known as: ALDACTONE TAKE TWO TABLETS BY MOUTH TWICE DAILY   spironolactone 100 MG tablet Commonly known as: ALDACTONE TAKE ONE TABLET EVERY MORNING   tiZANidine 4 MG tablet Commonly known as: ZANAFLEX Take 4 mg by mouth at bedtime.   tolterodine 4 MG 24 hr capsule Commonly known as: DETROL LA Take 4 mg by mouth daily.   topiramate 50 MG tablet Commonly known as: TOPAMAX Take 50 mg by mouth 2 (two) times  daily.   valACYclovir 1000 MG tablet Commonly known as: VALTREX Take 1,000 mg by mouth daily.          Follow-up Information     [facility].   Specialty: General Surgery Why: Please call to confirm your appointment time. We are working hard to make this for you. Please bring a copy of your photo ID and insurance card. Please arrive 45 minutes prior to your appointment for paperwork. Contact information: [address] [place] [address] [phone]         Toma Deiters, MD Follow up.   Specialty: Internal Medicine Contact information: [address] [place] [address] [phone]                 Signed: Leary Roca, [facility] 07/16/2021, 9:48 AM Please see Amion for pager number during day hours 7:00am-4:30pm

## 2021-07-16 NOTE — Progress Notes (Signed)
Pacu RN Report to floor given  Gave report to [employer]. [id number]. Discussed surgery, meds given in OR and Pacu, VS, IV fluids given, EBL, urine output, pain and other pertinent information. Also discussed if pt had any family or friends here or belongings with them. Discussed Fentanyl made her itchy, switched to Diladid, gave her Benadryl. She cant take Oxycodone, will need to switch to Vicodin or PO Dilaudid as suggested by Dr Maple Hudson.   Pt exits my care.

## 2021-07-19 ENCOUNTER — Encounter (HOSPITAL_COMMUNITY): Payer: Self-pay | Admitting: General Surgery

## 2021-07-19 LAB — SURGICAL PATHOLOGY

## 2021-07-19 NOTE — Anesthesia Postprocedure Evaluation (Signed)
Anesthesia Post Note  Patient: Towanda Malkin  Procedure(s) Performed: APPENDECTOMY LAPAROSCOPIC (Abdomen)     Patient location during evaluation: PACU Anesthesia Type: General Level of consciousness: awake and alert Pain management: pain level controlled Vital Signs Assessment: post-procedure vital signs reviewed and stable Respiratory status: spontaneous breathing, nonlabored ventilation, respiratory function stable and patient connected to nasal cannula oxygen Cardiovascular status: blood pressure returned to baseline and stable Postop Assessment: no apparent nausea or vomiting Anesthetic complications: no   No notable events documented.  Last Vitals:  Vitals:   07/16/21 0551 07/16/21 0949  BP: 103/76 116/75  Pulse: 92 93  Resp:  18  Temp: 36.7 C 36.8 C  SpO2: 100% 100%    Last Pain:  Vitals:   07/16/21 0949  TempSrc: Oral  PainSc:                  Maysen Bonsignore

## 2021-07-20 LAB — CULTURE, BLOOD (ROUTINE X 2)
Culture: NO GROWTH
Culture: NO GROWTH

## 2021-08-30 ENCOUNTER — Ambulatory Visit: Payer: 59 | Admitting: Family Medicine

## 2021-11-18 DIAGNOSIS — Z79899 Other long term (current) drug therapy: Secondary | ICD-10-CM | POA: Diagnosis not present

## 2021-11-18 DIAGNOSIS — E1169 Type 2 diabetes mellitus with other specified complication: Secondary | ICD-10-CM | POA: Diagnosis not present

## 2021-11-18 DIAGNOSIS — E038 Other specified hypothyroidism: Secondary | ICD-10-CM | POA: Diagnosis not present

## 2021-11-18 DIAGNOSIS — E782 Mixed hyperlipidemia: Secondary | ICD-10-CM | POA: Diagnosis not present

## 2021-12-09 ENCOUNTER — Other Ambulatory Visit: Payer: Self-pay | Admitting: Physician Assistant

## 2022-01-31 DIAGNOSIS — Z1231 Encounter for screening mammogram for malignant neoplasm of breast: Secondary | ICD-10-CM | POA: Diagnosis not present

## 2022-02-12 IMAGING — CT CT ABD-PELV W/ CM
2 of 5 series · 16 of 46 positions shown, 18 images · IV contrast (Omni 300)
Comparison: May 06, 2008

CLINICAL DATA: Right lower quadrant abdominal pain since [REDACTED].

EXAM:
CT ABDOMEN AND PELVIS WITH CONTRAST
TECHNIQUE: Multidetector CT imaging of the abdomen and pelvis was performed
using the standard protocol following bolus administration of
intravenous contrast.
CONTRAST:  100mL OMNIPAQUE IOHEXOL 350 MG/ML SOLN

[Series 3: a/p w/ 5mm · axial · 0.86mm/px · z∈[-215,+220]mm · 13 of 97 slices shown, 15 images]
[im 5/97  soft-tissue]
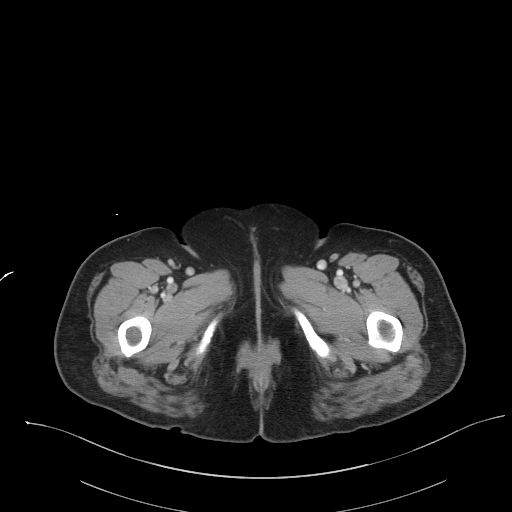
[im 5/97  bone]
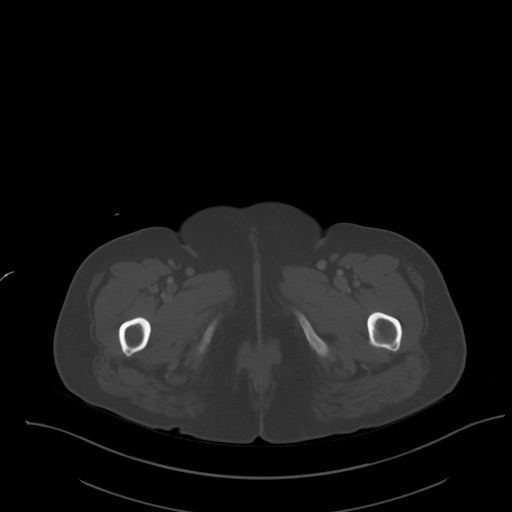
[im 15/97  soft-tissue]
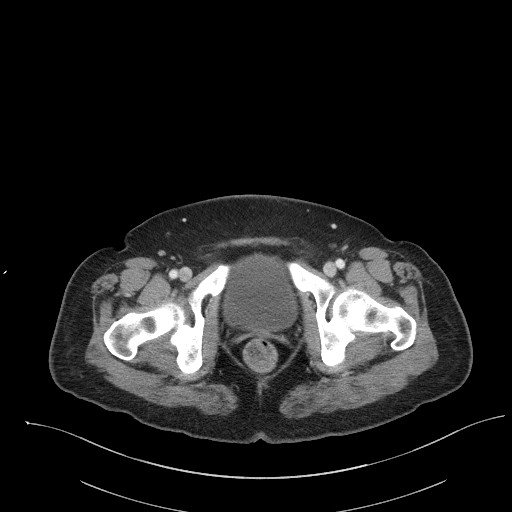
[im 20/97  soft-tissue]
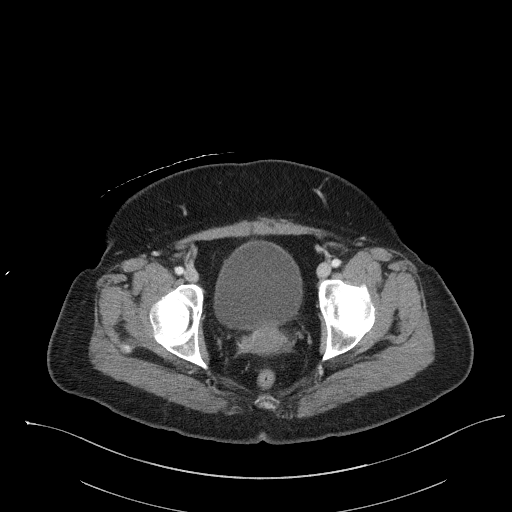
[im 29/97  soft-tissue]
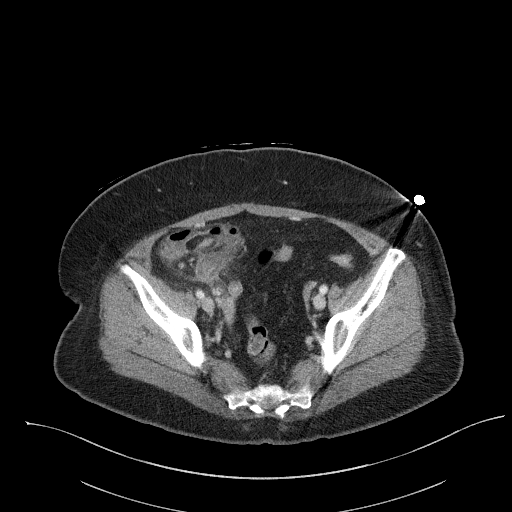
[im 34/97  soft-tissue]
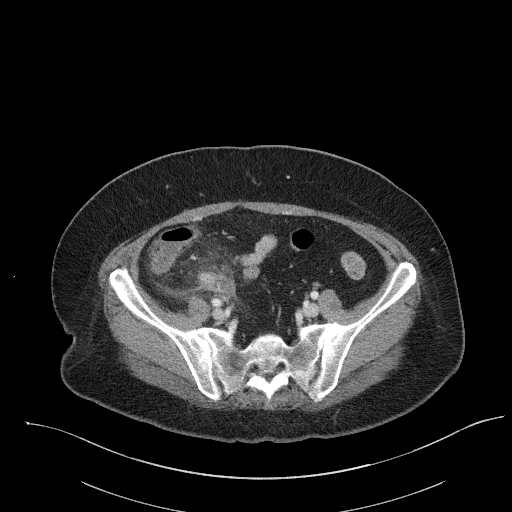
[im 44/97  soft-tissue]
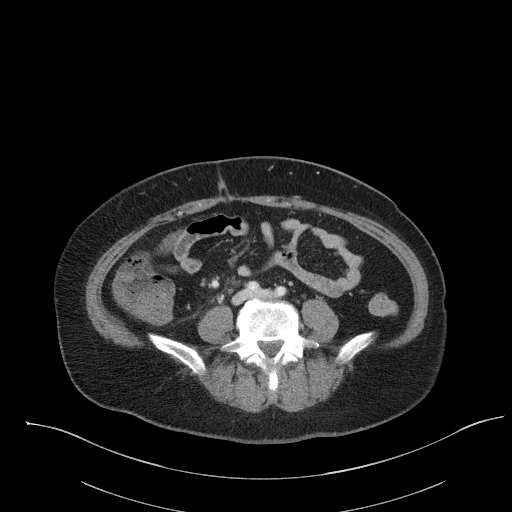
[im 49/97  soft-tissue]
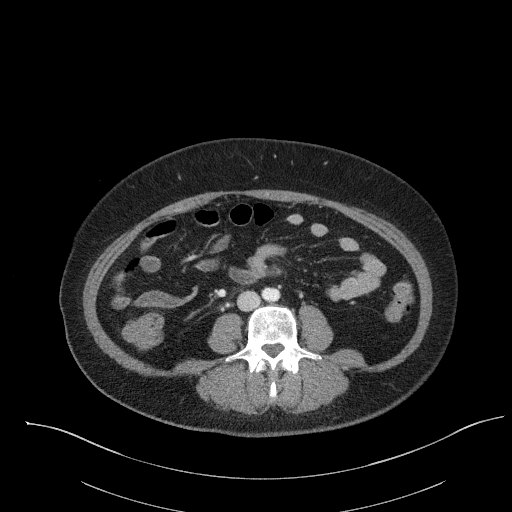
[im 53/97  soft-tissue]
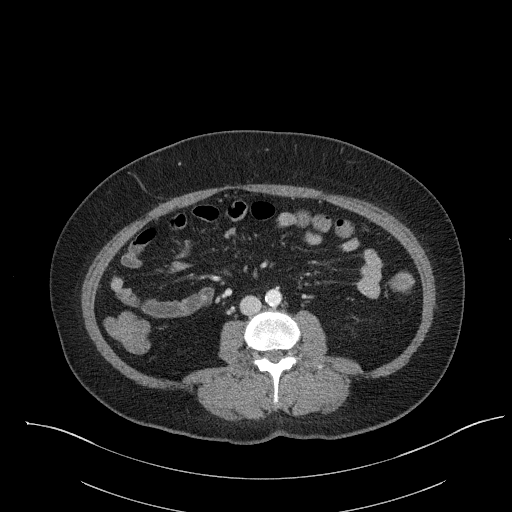
[im 63/97  soft-tissue]
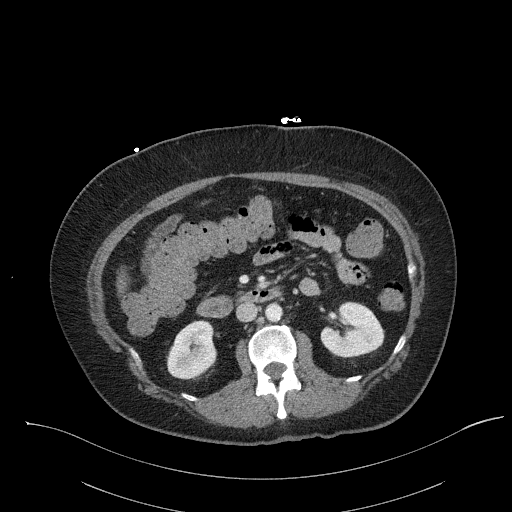
[im 63/97  bone]
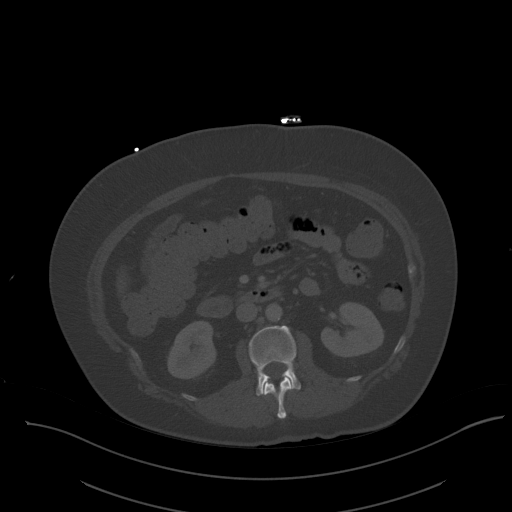
[im 68/97  soft-tissue]
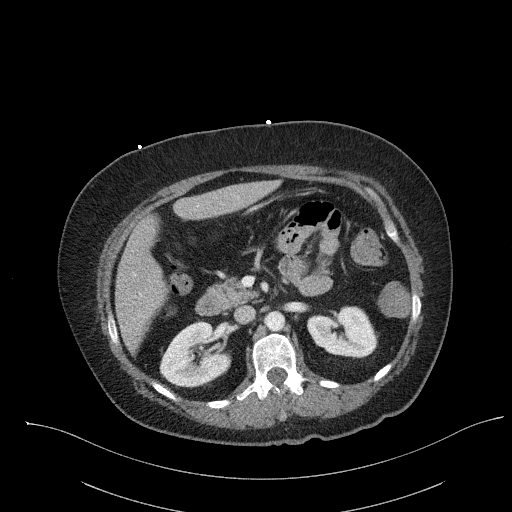
[im 77/97  soft-tissue]
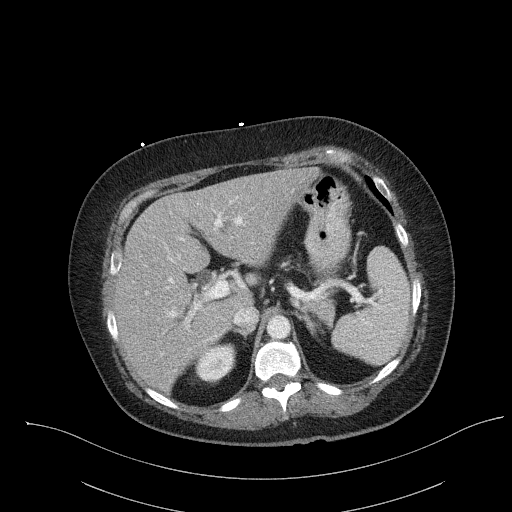
[im 82/97  soft-tissue]
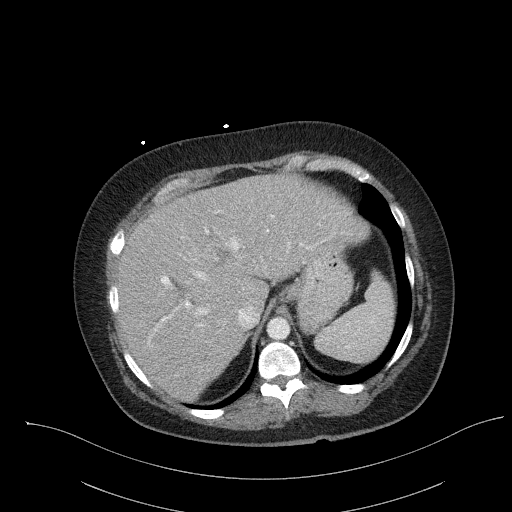
[im 92/97  soft-tissue]
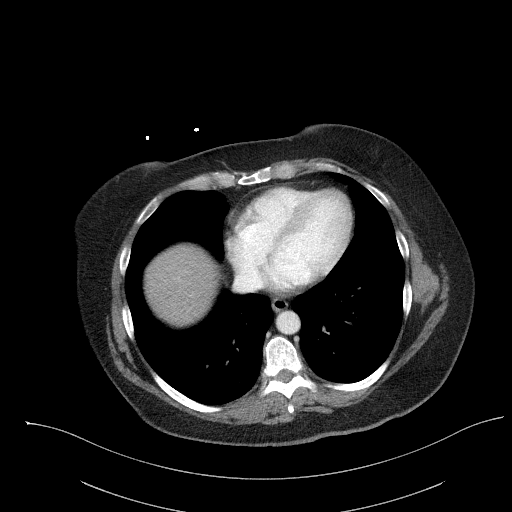

[Series 6: a/p w/ cor · coronal · 0.77mm/px · 3 of 138 slices shown]
[im 46/138  soft-tissue]
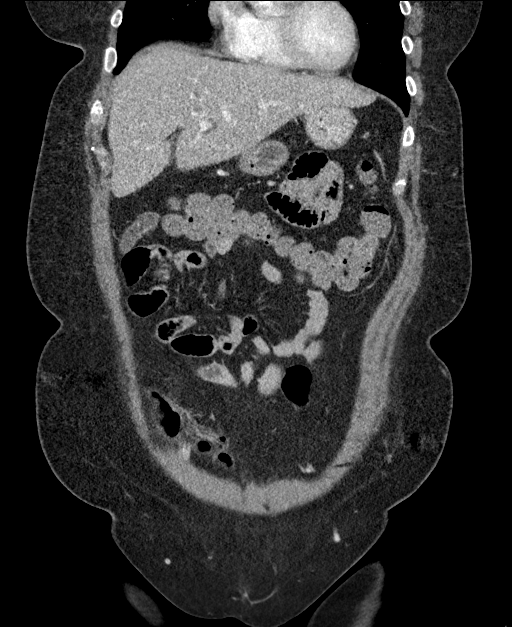
[im 61/138  soft-tissue]
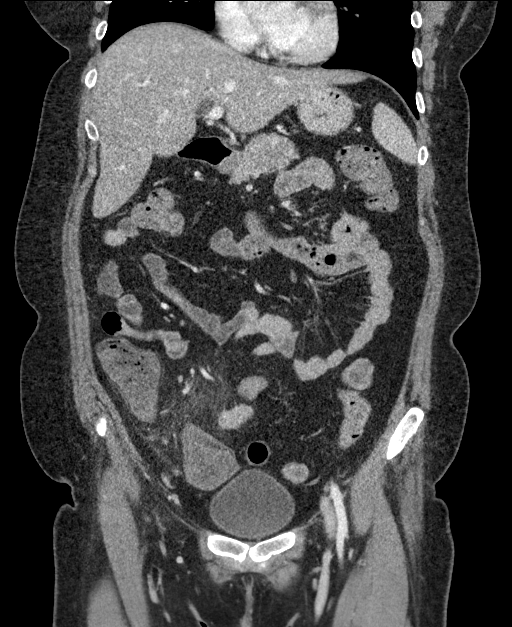
[im 77/138  soft-tissue]
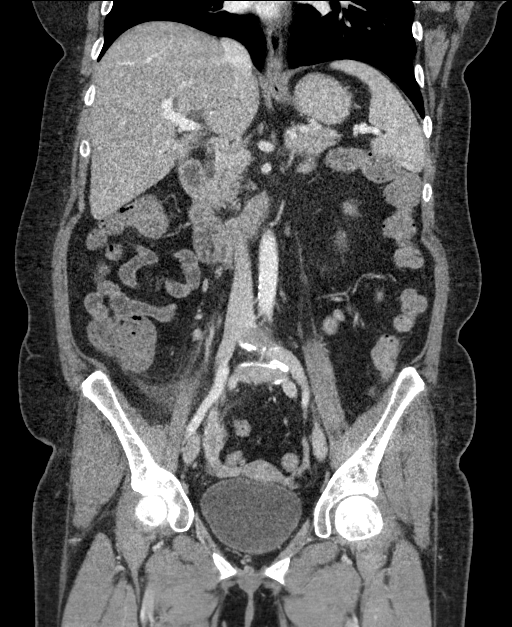

[16 of 46 positions shown; findings below may reference images not displayed]

FINDINGS: Lower chest: No acute abnormality.

Hepatobiliary: Diffuse hepatic steatosis. Gallbladder surgically
absent. Prominence of the intra and extrahepatic biliary tree favor
reservoir effect post cholecystectomy.

Pancreas: No evidence of acute pancreatic inflammation or pancreatic
ductal dilation.

Spleen: Within normal limits.

Adrenals/Urinary Tract: Adrenal glands are unremarkable. Kidneys are
normal, without renal calculi, solid enhancing lesion, or
hydronephrosis. Bladder is unremarkable for degree of distension.

Stomach/Bowel: Stomach is unremarkable for degree of distension. No
pathologic dilation of small or large bowel. Terminal ileum is
unremarkable.

Dilated fluid-filled appendix in the right hemipelvis measuring up
to 12 mm with mucosal hyperenhancement and periappendiceal fluid.

Vascular/Lymphatic: Aortic atherosclerosis without abdominal aortic
aneurysm. No pathologically enlarged abdominal or pelvic lymph
nodes.

Reproductive: Uterus and right adnexa are unremarkable. Similar size
of the left ovarian dermoid measuring 2.9 x 2.0 cm.

Other: No walled off fluid collections.  No pneumoperitoneum.

Musculoskeletal: L5-S1 discogenic disease. No acute osseous
abnormality.
IMPRESSION: 1. Acute uncomplicated appendicitis.
2. Diffuse hepatic steatosis.
3. Similar size of left ovarian dermoid measuring 2.9 cm.
4.  Aortic Atherosclerosis ([id number]).

## 2022-02-12 IMAGING — DX DG CHEST 1V PORT
1 series · 1 of 1 positions shown · non-contrast
Comparison: None.

CLINICAL DATA: Evaluate for free air.

EXAM:
PORTABLE CHEST 1 VIEW

[chest ap]
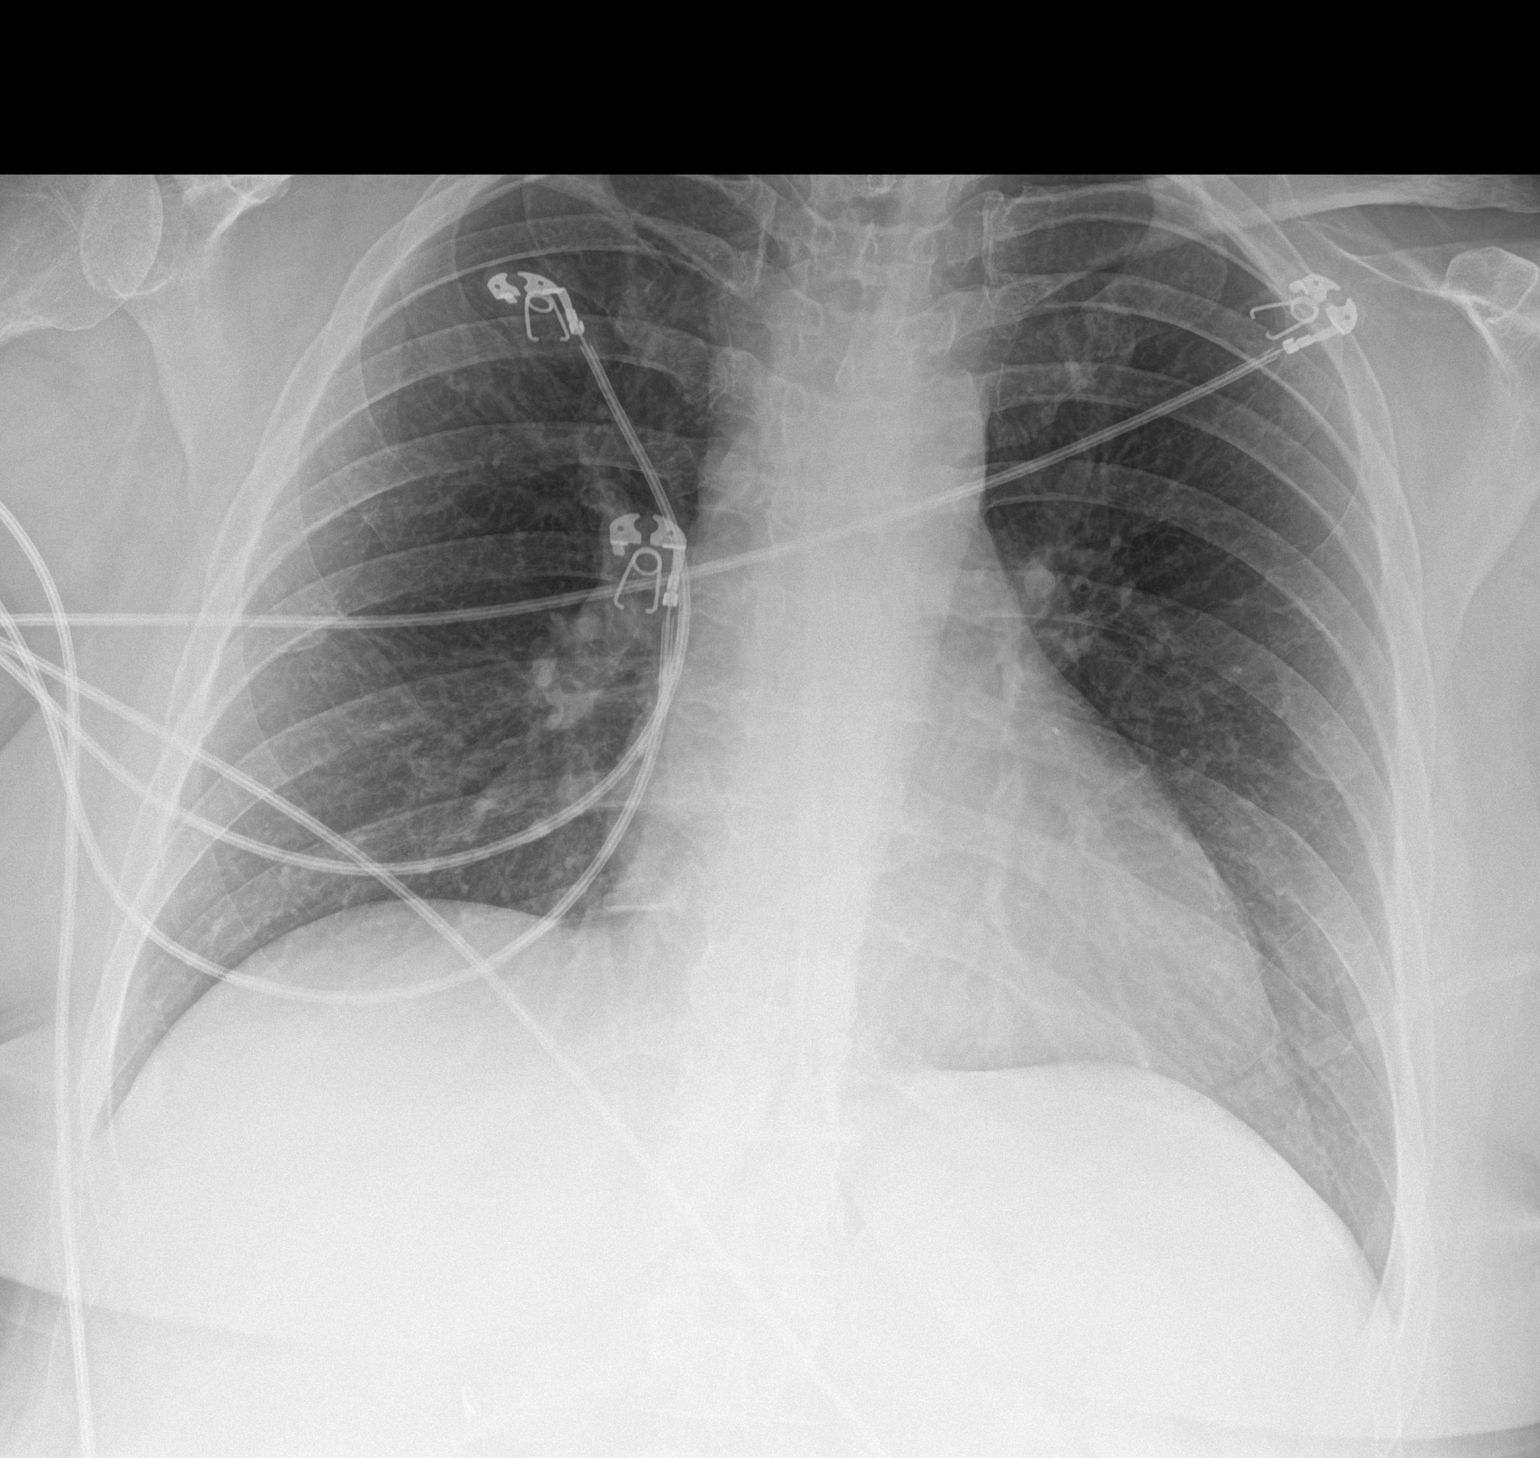

[1 of 1 positions shown; findings below may reference images not displayed]

FINDINGS: The heart size and mediastinal contours are within normal limits.
Both lungs are clear. The visualized skeletal structures are
unremarkable. There is no free air visualized under the diaphragm.
IMPRESSION: 1. No free air visualized under the diaphragm.
2. No acute cardiopulmonary process.

## 2022-02-16 DIAGNOSIS — E1169 Type 2 diabetes mellitus with other specified complication: Secondary | ICD-10-CM | POA: Diagnosis not present

## 2022-04-13 DIAGNOSIS — Z1151 Encounter for screening for human papillomavirus (HPV): Secondary | ICD-10-CM | POA: Diagnosis not present

## 2022-04-13 DIAGNOSIS — Z124 Encounter for screening for malignant neoplasm of cervix: Secondary | ICD-10-CM | POA: Diagnosis not present

## 2022-04-13 DIAGNOSIS — Z6830 Body mass index (BMI) 30.0-30.9, adult: Secondary | ICD-10-CM | POA: Diagnosis not present

## 2022-04-13 DIAGNOSIS — Z01419 Encounter for gynecological examination (general) (routine) without abnormal findings: Secondary | ICD-10-CM | POA: Diagnosis not present

## 2022-05-18 DIAGNOSIS — M545 Low back pain, unspecified: Secondary | ICD-10-CM | POA: Diagnosis not present

## 2022-05-18 DIAGNOSIS — E1169 Type 2 diabetes mellitus with other specified complication: Secondary | ICD-10-CM | POA: Diagnosis not present

## 2022-05-18 DIAGNOSIS — I10 Essential (primary) hypertension: Secondary | ICD-10-CM | POA: Diagnosis not present

## 2022-05-18 DIAGNOSIS — K219 Gastro-esophageal reflux disease without esophagitis: Secondary | ICD-10-CM | POA: Diagnosis not present

## 2022-06-20 ENCOUNTER — Other Ambulatory Visit: Payer: Self-pay | Admitting: Physician Assistant

## 2022-07-06 DIAGNOSIS — I10 Essential (primary) hypertension: Secondary | ICD-10-CM | POA: Diagnosis not present

## 2022-07-06 DIAGNOSIS — E1169 Type 2 diabetes mellitus with other specified complication: Secondary | ICD-10-CM | POA: Diagnosis not present

## 2022-07-06 DIAGNOSIS — K219 Gastro-esophageal reflux disease without esophagitis: Secondary | ICD-10-CM | POA: Diagnosis not present

## 2022-07-06 DIAGNOSIS — M545 Low back pain, unspecified: Secondary | ICD-10-CM | POA: Diagnosis not present

## 2022-10-05 DIAGNOSIS — E1169 Type 2 diabetes mellitus with other specified complication: Secondary | ICD-10-CM | POA: Diagnosis not present

## 2022-10-05 DIAGNOSIS — M545 Low back pain, unspecified: Secondary | ICD-10-CM | POA: Diagnosis not present

## 2022-10-05 DIAGNOSIS — L639 Alopecia areata, unspecified: Secondary | ICD-10-CM | POA: Diagnosis not present

## 2022-10-05 DIAGNOSIS — K219 Gastro-esophageal reflux disease without esophagitis: Secondary | ICD-10-CM | POA: Diagnosis not present

## 2022-10-28 ENCOUNTER — Ambulatory Visit: Payer: BC Managed Care – PPO | Admitting: Family Medicine

## 2022-10-28 ENCOUNTER — Encounter: Payer: Self-pay | Admitting: Family Medicine

## 2022-10-28 ENCOUNTER — Ambulatory Visit: Payer: Self-pay

## 2022-10-28 VITALS — BP 128/68 | Ht 61.0 in | Wt 165.0 lb

## 2022-10-28 DIAGNOSIS — M79671 Pain in right foot: Secondary | ICD-10-CM

## 2022-10-28 DIAGNOSIS — M7751 Other enthesopathy of right foot: Secondary | ICD-10-CM | POA: Diagnosis not present

## 2022-10-28 MED ORDER — MELOXICAM 15 MG PO TABS: 15.0000 mg | ORAL_TABLET | Freq: Every day | ORAL | 1 refills | Status: AC | PRN

## 2022-10-28 NOTE — Progress Notes (Signed)
  Desiree Carter - 51 y.o. female MRN [id number]  Date of birth: [birth year]  SUBJECTIVE:  Including CC & ROS.  No chief complaint on file.   Desiree Carter is a 51 y.o. female that is  presenting with acute on chronic right foot and ankle pain. Had an injury a few years ago. Pain started in November. Felt like her ankle was going to give out.  Independent review of the right ankle x-ray from 2021 shows no acute changes.  Review of Systems See HPI   HISTORY: Past Medical, Surgical, Social, and Family History Reviewed & Updated per EMR.   Pertinent Historical Findings include:  Past Medical History:  Diagnosis Date   ADD (attention deficit disorder)    Anemia    Anxiety    Degenerative disc disease, cervical    Hyperglycemia    Hypertension    Obesity     Past Surgical History:  Procedure Laterality Date   CHOLECYSTECTOMY     LAPAROSCOPIC APPENDECTOMY N/A 07/15/2021   Procedure: APPENDECTOMY LAPAROSCOPIC;  Surgeon: Rodman Pickle, MD;  Location: [facility] OR;  Service: General;  Laterality: N/A;   OVARIAN CYST DRAINAGE     TONSILLECTOMY AND ADENOIDECTOMY       PHYSICAL EXAM:  VS: BP 128/68   Ht 5\' 1"  (1.549 m)   Wt 165 lb (74.8 kg)   BMI 31.18 kg/m  Physical Exam Gen: NAD, alert, cooperative with exam, well-appearing MSK:  Neurovascularly intact    Limited ultrasound: right foot/ankle pain:  No ankle effusion. No significant degenerative changes within the midfoot. Has a very mild synovitis of the first MTP joint. Normal-appearing posterior tibialis and peroneal tendinitis  Summary: No significant structural findings appreciated  Ultrasound and interpretation by , MD    ASSESSMENT & PLAN:   Capsulitis of ankle, right Acutely occurring.  Has limited dorsiflexion compared to contralateral side.  No significant structural findings.  -Counseled on home exercise therapy and supportive care. -Meloxicam. -Could consider physical therapy  or injection.

## 2022-10-28 NOTE — Patient Instructions (Signed)
Nice to meet you Please try ice  Please try the exercises   Please send me a message in MyChart with any questions or updates.  Please see me back in 4-6 weeks.   --Dr. Mell Mellott  

## 2022-10-28 NOTE — Assessment & Plan Note (Signed)
Acutely occurring.  Has limited dorsiflexion compared to contralateral side.  No significant structural findings.  -Counseled on home exercise therapy and supportive care. -Meloxicam. -Could consider physical therapy or injection.

## 2022-11-28 DIAGNOSIS — J309 Allergic rhinitis, unspecified: Secondary | ICD-10-CM | POA: Diagnosis not present

## 2022-11-28 DIAGNOSIS — B001 Herpesviral vesicular dermatitis: Secondary | ICD-10-CM | POA: Diagnosis not present

## 2022-11-28 DIAGNOSIS — E785 Hyperlipidemia, unspecified: Secondary | ICD-10-CM | POA: Diagnosis not present

## 2022-11-28 DIAGNOSIS — R7309 Other abnormal glucose: Secondary | ICD-10-CM | POA: Diagnosis not present

## 2022-11-28 DIAGNOSIS — Z23 Encounter for immunization: Secondary | ICD-10-CM | POA: Diagnosis not present

## 2022-11-28 DIAGNOSIS — K219 Gastro-esophageal reflux disease without esophagitis: Secondary | ICD-10-CM | POA: Diagnosis not present

## 2022-11-28 DIAGNOSIS — I10 Essential (primary) hypertension: Secondary | ICD-10-CM | POA: Diagnosis not present

## 2022-11-28 DIAGNOSIS — M545 Low back pain, unspecified: Secondary | ICD-10-CM | POA: Diagnosis not present

## 2022-12-27 DIAGNOSIS — R051 Acute cough: Secondary | ICD-10-CM | POA: Diagnosis not present

## 2022-12-27 DIAGNOSIS — R0981 Nasal congestion: Secondary | ICD-10-CM | POA: Diagnosis not present

## 2023-02-10 DIAGNOSIS — S233XXA Sprain of ligaments of thoracic spine, initial encounter: Secondary | ICD-10-CM | POA: Diagnosis not present

## 2023-02-10 DIAGNOSIS — S338XXA Sprain of other parts of lumbar spine and pelvis, initial encounter: Secondary | ICD-10-CM | POA: Diagnosis not present

## 2023-02-10 DIAGNOSIS — S134XXA Sprain of ligaments of cervical spine, initial encounter: Secondary | ICD-10-CM | POA: Diagnosis not present

## 2023-02-20 ENCOUNTER — Encounter: Payer: Self-pay | Admitting: *Deleted

## 2023-03-10 DIAGNOSIS — S233XXA Sprain of ligaments of thoracic spine, initial encounter: Secondary | ICD-10-CM | POA: Diagnosis not present

## 2023-03-10 DIAGNOSIS — S338XXA Sprain of other parts of lumbar spine and pelvis, initial encounter: Secondary | ICD-10-CM | POA: Diagnosis not present

## 2023-03-10 DIAGNOSIS — S134XXA Sprain of ligaments of cervical spine, initial encounter: Secondary | ICD-10-CM | POA: Diagnosis not present

## 2023-05-03 DIAGNOSIS — Z1330 Encounter for screening examination for mental health and behavioral disorders, unspecified: Secondary | ICD-10-CM | POA: Diagnosis not present

## 2023-05-03 DIAGNOSIS — J988 Other specified respiratory disorders: Secondary | ICD-10-CM | POA: Diagnosis not present

## 2023-05-03 DIAGNOSIS — Z20822 Contact with and (suspected) exposure to covid-19: Secondary | ICD-10-CM | POA: Diagnosis not present

## 2023-05-03 DIAGNOSIS — R0600 Dyspnea, unspecified: Secondary | ICD-10-CM | POA: Diagnosis not present

## 2023-05-03 DIAGNOSIS — I10 Essential (primary) hypertension: Secondary | ICD-10-CM | POA: Diagnosis not present

## 2023-05-03 DIAGNOSIS — J069 Acute upper respiratory infection, unspecified: Secondary | ICD-10-CM | POA: Diagnosis not present

## 2023-05-22 DIAGNOSIS — M5410 Radiculopathy, site unspecified: Secondary | ICD-10-CM | POA: Diagnosis not present

## 2023-05-22 DIAGNOSIS — J309 Allergic rhinitis, unspecified: Secondary | ICD-10-CM | POA: Diagnosis not present

## 2023-05-22 DIAGNOSIS — G2581 Restless legs syndrome: Secondary | ICD-10-CM | POA: Diagnosis not present

## 2023-05-22 DIAGNOSIS — I10 Essential (primary) hypertension: Secondary | ICD-10-CM | POA: Diagnosis not present

## 2023-05-22 DIAGNOSIS — Z0000 Encounter for general adult medical examination without abnormal findings: Secondary | ICD-10-CM | POA: Diagnosis not present

## 2023-05-29 DIAGNOSIS — Z01419 Encounter for gynecological examination (general) (routine) without abnormal findings: Secondary | ICD-10-CM | POA: Diagnosis not present

## 2023-05-29 DIAGNOSIS — I10 Essential (primary) hypertension: Secondary | ICD-10-CM | POA: Diagnosis not present

## 2023-05-29 DIAGNOSIS — R87610 Atypical squamous cells of undetermined significance on cytologic smear of cervix (ASC-US): Secondary | ICD-10-CM | POA: Diagnosis not present

## 2023-05-29 DIAGNOSIS — Z6832 Body mass index (BMI) 32.0-32.9, adult: Secondary | ICD-10-CM | POA: Diagnosis not present

## 2023-05-29 DIAGNOSIS — Z1151 Encounter for screening for human papillomavirus (HPV): Secondary | ICD-10-CM | POA: Diagnosis not present

## 2023-05-29 DIAGNOSIS — Z124 Encounter for screening for malignant neoplasm of cervix: Secondary | ICD-10-CM | POA: Diagnosis not present

## 2023-05-29 DIAGNOSIS — Z9889 Other specified postprocedural states: Secondary | ICD-10-CM | POA: Diagnosis not present

## 2023-06-30 DIAGNOSIS — Z01812 Encounter for preprocedural laboratory examination: Secondary | ICD-10-CM | POA: Diagnosis not present

## 2023-06-30 DIAGNOSIS — R87610 Atypical squamous cells of undetermined significance on cytologic smear of cervix (ASC-US): Secondary | ICD-10-CM | POA: Diagnosis not present

## 2023-06-30 DIAGNOSIS — R87810 Cervical high risk human papillomavirus (HPV) DNA test positive: Secondary | ICD-10-CM | POA: Diagnosis not present

## 2023-08-18 DIAGNOSIS — S233XXA Sprain of ligaments of thoracic spine, initial encounter: Secondary | ICD-10-CM | POA: Diagnosis not present

## 2023-08-18 DIAGNOSIS — S134XXA Sprain of ligaments of cervical spine, initial encounter: Secondary | ICD-10-CM | POA: Diagnosis not present

## 2023-08-18 DIAGNOSIS — S338XXA Sprain of other parts of lumbar spine and pelvis, initial encounter: Secondary | ICD-10-CM | POA: Diagnosis not present

## 2023-08-25 DIAGNOSIS — S338XXA Sprain of other parts of lumbar spine and pelvis, initial encounter: Secondary | ICD-10-CM | POA: Diagnosis not present

## 2023-08-25 DIAGNOSIS — S233XXA Sprain of ligaments of thoracic spine, initial encounter: Secondary | ICD-10-CM | POA: Diagnosis not present

## 2023-08-25 DIAGNOSIS — S134XXA Sprain of ligaments of cervical spine, initial encounter: Secondary | ICD-10-CM | POA: Diagnosis not present

## 2023-09-04 DIAGNOSIS — S134XXA Sprain of ligaments of cervical spine, initial encounter: Secondary | ICD-10-CM | POA: Diagnosis not present

## 2023-09-04 DIAGNOSIS — S338XXA Sprain of other parts of lumbar spine and pelvis, initial encounter: Secondary | ICD-10-CM | POA: Diagnosis not present

## 2023-09-04 DIAGNOSIS — S233XXA Sprain of ligaments of thoracic spine, initial encounter: Secondary | ICD-10-CM | POA: Diagnosis not present

## 2023-09-11 DIAGNOSIS — S233XXA Sprain of ligaments of thoracic spine, initial encounter: Secondary | ICD-10-CM | POA: Diagnosis not present

## 2023-09-11 DIAGNOSIS — S134XXA Sprain of ligaments of cervical spine, initial encounter: Secondary | ICD-10-CM | POA: Diagnosis not present

## 2023-09-11 DIAGNOSIS — S338XXA Sprain of other parts of lumbar spine and pelvis, initial encounter: Secondary | ICD-10-CM | POA: Diagnosis not present

## 2023-09-18 ENCOUNTER — Ambulatory Visit: Payer: BC Managed Care – PPO | Admitting: Family Medicine

## 2023-09-18 ENCOUNTER — Encounter: Payer: Self-pay | Admitting: Family Medicine

## 2023-09-18 VITALS — BP 130/88 | Ht 61.0 in | Wt 180.0 lb

## 2023-09-18 DIAGNOSIS — M25562 Pain in left knee: Secondary | ICD-10-CM

## 2023-09-18 DIAGNOSIS — G8929 Other chronic pain: Secondary | ICD-10-CM

## 2023-09-18 NOTE — Patient Instructions (Signed)
We will go ahead with an MRI to assess for a flap or bucket-handle meniscus tear. Knee brace for support. Icing 15 minutes at a time as needed. Elevate as needed for swelling. Ibuprofen as needed for pain and inflammation. I'll contact you with MRI results and next steps.

## 2023-09-18 NOTE — Progress Notes (Signed)
PCP: Toma Deiters, MD  Subjective:   HPI: Patient is a 52 y.o. female here for left knee pain.  Patient reports about 2 years ago she recalls having pain without obvious injury. Has not persisted since that time though. She's had issues with sciatica dating back 1.5 years and assumed current issues were related to that. Has had discomfort past few months with knee feeling tight. Has seen chiropractor and done physical therapy since 10/11 without much improvement. Concern there with patient having catching and locking of the knee along with the swelling. Worse with stairs. Pain is medial.  Past Medical History:  Diagnosis Date   ADD (attention deficit disorder)    Anemia    Anxiety    Degenerative disc disease, cervical    Hyperglycemia    Hypertension    Obesity     Current Outpatient Medications on File Prior to Visit  Medication Sig Dispense Refill   cholecalciferol (VITAMIN D3) 25 MCG (1000 UNIT) tablet Take 1,000 Units by mouth every evening.     fenofibrate (TRICOR) 145 MG tablet Take 145 mg by mouth daily.     finasteride (PROSCAR) 5 MG tablet Take 5 mg by mouth daily.     gabapentin (NEURONTIN) 400 MG capsule Take 1,200 mg by mouth at bedtime.      Garlic 400 MG TBEC Take 1 tablet by mouth every evening.     HYDROcodone-acetaminophen (NORCO) 10-325 MG tablet Take 1 tablet by mouth 3 (three) times daily as needed for moderate pain or severe pain.     meloxicam (MOBIC) 15 MG tablet Take 1 tablet (15 mg total) by mouth daily as needed. 45 tablet 1   metFORMIN (GLUCOPHAGE-XR) 500 MG 24 hr tablet Take 1 tablet by mouth daily.     minoxidil (LONITEN) 2.5 MG tablet Take 1/2 pill qd (Patient taking differently: Take 1.25 mg by mouth at bedtime.) 90 tablet 1   multivitamin (VIT W/EXTRA C) CHEW chewable tablet Chew 1 tablet by mouth every evening.     omeprazole (PRILOSEC) 40 MG capsule Take 40 mg by mouth daily.     spironolactone (ALDACTONE) 100 MG tablet TAKE ONE TABLET  EVERY MORNING 30 tablet 4   spironolactone (ALDACTONE) 25 MG tablet TAKE TWO TABLETS BY MOUTH TWICE DAILY (Patient not taking: Reported on 07/15/2021) 60 tablet 5   tiZANidine (ZANAFLEX) 4 MG tablet Take 4 mg by mouth at bedtime.     tolterodine (DETROL LA) 4 MG 24 hr capsule Take 4 mg by mouth daily.     topiramate (TOPAMAX) 50 MG tablet Take 50 mg by mouth 2 (two) times daily.     valACYclovir (VALTREX) 1000 MG tablet Take 1,000 mg by mouth daily.     No current facility-administered medications on file prior to visit.    Past Surgical History:  Procedure Laterality Date   CHOLECYSTECTOMY     LAPAROSCOPIC APPENDECTOMY N/A 07/15/2021   Procedure: APPENDECTOMY LAPAROSCOPIC;  Surgeon: Kinsinger, De Blanch, MD;  Location: [facility] OR;  Service: General;  Laterality: N/A;   OVARIAN CYST DRAINAGE     TONSILLECTOMY AND ADENOIDECTOMY      Allergies  Allergen Reactions   Metronidazole Swelling   Morphine Other (See Comments)   Propoxyphene Nausea And Vomiting   Clindamycin/Lincomycin Rash   Fentanyl Itching    BP 130/88   Ht 5\' 1"  (1.549 m)   Wt 180 lb (81.6 kg)   BMI 34.01 kg/m      07/05/2021    2:31 PM  [facility]  Frequency of aerobic exercise (# of days/week) 0  Average time in minutes 0  Frequency of strengthening activities (# of days/week) 0        No data to display              Objective:  Physical Exam:  Gen: NAD, comfortable in exam room  Left knee: Mild effusion.  No other gross deformity, ecchymoses. TTP medial joint line. FROM with normal strength. Negative ant/post drawers. Negative valgus/varus testing. Negative lachman. Positive thessalys, apleys, mcmurrays. NV intact distally.   Assessment & Plan:  1. Left knee pain - with catching, locking.  Concern for bucket handle or flap medial meniscus tear.  Has done 1 month of PT and chiropractic care.  X-rays at chiropractor with mild degenerative changes.  Will proceed with MRI  given her mechanical symptoms.  Knee brace, icing, ibuprofen if needed.  Expect to refer to ortho following results to discuss surgical intervention.

## 2023-09-19 ENCOUNTER — Ambulatory Visit
Admission: RE | Admit: 2023-09-19 | Discharge: 2023-09-19 | Disposition: A | Discharge status: Home / Self Care | Payer: BC Managed Care – PPO | Source: Ambulatory Visit | Attending: Family Medicine | Admitting: Family Medicine

## 2023-09-19 DIAGNOSIS — M25462 Effusion, left knee: Secondary | ICD-10-CM | POA: Diagnosis not present

## 2023-09-19 DIAGNOSIS — S83282A Other tear of lateral meniscus, current injury, left knee, initial encounter: Secondary | ICD-10-CM | POA: Diagnosis not present

## 2023-09-19 DIAGNOSIS — S72432A Displaced fracture of medial condyle of left femur, initial encounter for closed fracture: Secondary | ICD-10-CM | POA: Diagnosis not present

## 2023-09-19 DIAGNOSIS — G8929 Other chronic pain: Secondary | ICD-10-CM

## 2023-09-19 DIAGNOSIS — M25562 Pain in left knee: Secondary | ICD-10-CM | POA: Diagnosis not present

## 2023-09-25 ENCOUNTER — Encounter: Payer: Self-pay | Admitting: Family Medicine

## 2023-09-26 NOTE — Telephone Encounter (Signed)
Called and discussed - see note on mri report for details.

## 2023-10-03 DIAGNOSIS — S83242A Other tear of medial meniscus, current injury, left knee, initial encounter: Secondary | ICD-10-CM | POA: Diagnosis not present

## 2023-10-03 DIAGNOSIS — S83282A Other tear of lateral meniscus, current injury, left knee, initial encounter: Secondary | ICD-10-CM | POA: Diagnosis not present

## 2023-10-13 ENCOUNTER — Other Ambulatory Visit: Payer: BC Managed Care – PPO

## 2023-10-23 DIAGNOSIS — Y999 Unspecified external cause status: Secondary | ICD-10-CM | POA: Diagnosis not present

## 2023-10-23 DIAGNOSIS — S83242A Other tear of medial meniscus, current injury, left knee, initial encounter: Secondary | ICD-10-CM | POA: Diagnosis not present

## 2023-10-23 DIAGNOSIS — S83272A Complex tear of lateral meniscus, current injury, left knee, initial encounter: Secondary | ICD-10-CM | POA: Diagnosis not present

## 2023-10-23 DIAGNOSIS — X58XXXA Exposure to other specified factors, initial encounter: Secondary | ICD-10-CM | POA: Diagnosis not present

## 2023-10-23 DIAGNOSIS — S83282A Other tear of lateral meniscus, current injury, left knee, initial encounter: Secondary | ICD-10-CM | POA: Diagnosis not present

## 2023-10-26 DIAGNOSIS — S83232D Complex tear of medial meniscus, current injury, left knee, subsequent encounter: Secondary | ICD-10-CM | POA: Diagnosis not present

## 2023-10-26 DIAGNOSIS — S83272D Complex tear of lateral meniscus, current injury, left knee, subsequent encounter: Secondary | ICD-10-CM | POA: Diagnosis not present
